# Patient Record
Sex: Male | Born: 1970 | Race: White | Hispanic: No | Marital: Married | Smoking: Never smoker
Health system: Southern US, Community
[De-identification: ages and names within clinical notes are randomized; demographics above are authoritative.]

## PROBLEM LIST (undated history)

## (undated) DIAGNOSIS — N390 Urinary tract infection, site not specified: Secondary | ICD-10-CM

## (undated) HISTORY — DX: Urinary tract infection, site not specified: N39.0

## (undated) HISTORY — PX: NO PAST SURGERIES: SHX2092

---

## 2008-04-01 ENCOUNTER — Ambulatory Visit: Payer: Self-pay | Admitting: Family Medicine

## 2012-02-28 ENCOUNTER — Emergency Department: Payer: Self-pay | Admitting: Emergency Medicine

## 2015-09-22 ENCOUNTER — Ambulatory Visit
Admission: EM | Admit: 2015-09-22 | Discharge: 2015-09-22 | Disposition: A | Discharge status: Home / Self Care | Payer: BC Managed Care – PPO | Attending: Family Medicine | Admitting: Family Medicine

## 2015-09-22 ENCOUNTER — Encounter: Payer: Self-pay | Admitting: Emergency Medicine

## 2015-09-22 DIAGNOSIS — Z79899 Other long term (current) drug therapy: Secondary | ICD-10-CM | POA: Insufficient documentation

## 2015-09-22 DIAGNOSIS — N390 Urinary tract infection, site not specified: Secondary | ICD-10-CM | POA: Diagnosis not present

## 2015-09-22 DIAGNOSIS — R300 Dysuria: Secondary | ICD-10-CM | POA: Diagnosis present

## 2015-09-22 LAB — URINALYSIS COMPLETE WITH MICROSCOPIC (ARMC ONLY)
Glucose, UA: NEGATIVE mg/dL
Ketones, ur: NEGATIVE mg/dL
Nitrite: NEGATIVE
PH: 6.5 (ref 5.0–8.0)
PROTEIN: 100 mg/dL — AB
Specific Gravity, Urine: 1.02 (ref 1.005–1.030)

## 2015-09-22 MED ORDER — CIPROFLOXACIN HCL 500 MG PO TABS: 500.0000 mg | ORAL_TABLET | Freq: Two times a day (BID) | ORAL | Status: DC

## 2015-09-22 NOTE — Discharge Instructions (Signed)

## 2015-09-22 NOTE — ED Notes (Signed)
Patient c/o fever and difficulty urinating that started on Saturday.  Patient denies pain or burning.

## 2015-09-22 NOTE — ED Provider Notes (Signed)
CSN: [id number]     Arrival date & time 09/22/15  0909 History   First MD Initiated Contact with Patient 09/22/15 [phone]     Chief Complaint  Patient presents with  . Dysuria   (Consider location/radiation/quality/duration/timing/severity/associated sxs/prior Treatment) HPI Comments: 45 yo male with a 5 days h/o progressively worsening discomfort with urination (mild burning).  Patient states has had urinary frequency and fevers (subjective; did not check his temperature). Denies any abdominal pain, flank pain, hematuria, penile discharge, vomiting.   The history is provided by the patient.    History reviewed. No pertinent past medical history. History reviewed. No pertinent past surgical history. History reviewed. No pertinent family history. Social History  Substance Use Topics  . Smoking status: Never Smoker   . Smokeless tobacco: Never Used  . Alcohol Use: No    Review of Systems  Allergies  Review of patient's allergies indicates no known allergies.  Home Medications   Prior to Admission medications   Medication Sig Start Date End Date Taking? Authorizing Provider  ciprofloxacin (CIPRO) 500 MG tablet Take 1 tablet (500 mg total) by mouth every 12 (twelve) hours. 09/22/15   Payton Mccallumrlando Arn Mcomber, MD   Meds Ordered and Administered this Visit  Medications - No data to display  BP 132/96 mmHg  Pulse 74  Temp(Src) 97.9 F (36.6 C) (Oral)  Resp 16  Ht 6\' 2"  (1.88 m)  Wt 265 lb (120.203 kg)  BMI 34.01 kg/m2  SpO2 96% No data found.   Physical Exam  Constitutional: He is oriented to person, place, and time. He appears well-developed and well-nourished. No distress.  HENT:  Head: Normocephalic and atraumatic.  Abdominal: Soft. He exhibits no distension.  Neurological: He is alert and oriented to person, place, and time.  Skin: He is not diaphoretic.  Nursing note and vitals reviewed.   ED Course  Procedures (including critical care time)  Labs Review Labs Reviewed   URINALYSIS COMPLETEWITH MICROSCOPIC ([facility] ONLY) - Abnormal; Notable for the following:    APPearance CLOUDY (*)    Bilirubin Urine 1+ (*)    Hgb urine dipstick 2+ (*)    Protein, ur 100 (*)    Leukocytes, UA 3+ (*)    Bacteria, UA FEW (*)    Squamous Epithelial / LPF 0-5 (*)    All other components within normal limits  URINE CULTURE    Imaging Review No results found.   Visual Acuity Review  Right Eye Distance:   Left Eye Distance:   Bilateral Distance:    Right Eye Near:   Left Eye Near:    Bilateral Near:         MDM   1. UTI (lower urinary tract infection)    Discharge Medication List as of 09/22/2015 10:03 AM    START taking these medications   Details  ciprofloxacin (CIPRO) 500 MG tablet Take 1 tablet (500 mg total) by mouth every 12 (twelve) hours., Starting 09/22/2015, Until Discontinued, Normal       1. Lab results and diagnosis reviewed with patient 2. rx as per orders above; reviewed possible side effects, interactions, risks and benefits  3. Recommend supportive treatment with increased water intake 4. Recommend establish care with a  PCP 5. Follow-up prn if symptoms worsen or don't improve    Payton Mccallumrlando Purvis Sidle, MD 09/22/15 1016

## 2015-09-24 LAB — URINE CULTURE: Culture: >=100000 — AB

## 2016-06-26 ENCOUNTER — Ambulatory Visit (INDEPENDENT_AMBULATORY_CARE_PROVIDER_SITE_OTHER): Payer: BC Managed Care – PPO | Admitting: Family Medicine

## 2016-06-26 ENCOUNTER — Encounter: Payer: Self-pay | Admitting: Family Medicine

## 2016-06-26 VITALS — BP 158/104 | HR 71 | Temp 98.3°F | Ht 73.0 in | Wt 295.0 lb

## 2016-06-26 DIAGNOSIS — E669 Obesity, unspecified: Secondary | ICD-10-CM | POA: Insufficient documentation

## 2016-06-26 DIAGNOSIS — I10 Essential (primary) hypertension: Secondary | ICD-10-CM | POA: Insufficient documentation

## 2016-06-26 DIAGNOSIS — R030 Elevated blood-pressure reading, without diagnosis of hypertension: Secondary | ICD-10-CM

## 2016-06-26 DIAGNOSIS — Z0001 Encounter for general adult medical examination with abnormal findings: Secondary | ICD-10-CM | POA: Insufficient documentation

## 2016-06-26 DIAGNOSIS — Z0000 Encounter for general adult medical examination without abnormal findings: Secondary | ICD-10-CM

## 2016-06-26 LAB — LIPID PANEL
CHOL/HDL RATIO: 6
Cholesterol: 214 mg/dL — ABNORMAL HIGH (ref 0–200)
HDL: 35.1 mg/dL — ABNORMAL LOW (ref >39.00)
LDL CALC: 145 mg/dL — AB (ref 0–99)
NonHDL: 179.16
TRIGLYCERIDES: 172 mg/dL — AB (ref 0.0–149.0)
VLDL: 34.4 mg/dL (ref 0.0–40.0)

## 2016-06-26 LAB — CBC
HCT: 47.6 % (ref 39.0–52.0)
HEMOGLOBIN: 16.2 g/dL (ref 13.0–17.0)
MCHC: 33.9 g/dL (ref 30.0–36.0)
MCV: 90.5 fl (ref 78.0–100.0)
Platelets: 317 10*3/uL (ref 150.0–400.0)
RBC: 5.26 Mil/uL (ref 4.22–5.81)
RDW: 12.6 % (ref 11.5–15.5)
WBC: 10.3 10*3/uL (ref 4.0–10.5)

## 2016-06-26 LAB — COMPREHENSIVE METABOLIC PANEL
ALT: 22 U/L (ref 0–53)
AST: 20 U/L (ref 0–37)
Albumin: 4.5 g/dL (ref 3.5–5.2)
Alkaline Phosphatase: 85 U/L (ref 39–117)
BUN: 11 mg/dL (ref 6–23)
CALCIUM: 9.6 mg/dL (ref 8.4–10.5)
CHLORIDE: 100 meq/L (ref 96–112)
CO2: 32 meq/L (ref 19–32)
CREATININE: 0.95 mg/dL (ref 0.40–1.50)
GFR: 90.3 mL/min (ref >60.00)
Glucose, Bld: 120 mg/dL — ABNORMAL HIGH (ref 70–99)
POTASSIUM: 4.1 meq/L (ref 3.5–5.1)
SODIUM: 139 meq/L (ref 135–145)
Total Bilirubin: 0.7 mg/dL (ref 0.2–1.2)
Total Protein: 7.6 g/dL (ref 6.0–8.3)

## 2016-06-26 LAB — HEMOGLOBIN A1C: Hgb A1c MFr Bld: 7.3 % — ABNORMAL HIGH (ref 4.6–6.5)

## 2016-06-26 NOTE — Progress Notes (Signed)
Pre visit review using our clinic review tool, if applicable. No additional management support is needed unless otherwise documented below in the visit note. 

## 2016-06-26 NOTE — Assessment & Plan Note (Signed)
Tetanus up-to-date. Declines flu vaccine. Screening labs today. BP elevated. See separate problem.

## 2016-06-26 NOTE — Patient Instructions (Signed)
Check BP at home.  Follow up in 2 weeks.  Take care  Dr. Adriana Simas     Health Maintenance, Male A healthy lifestyle and preventive care is important for your health and wellness. Ask your health care provider about what schedule of regular examinations is right for you. What should I know about weight and diet?  Eat a Healthy Diet  Eat plenty of vegetables, fruits, whole grains, low-fat dairy products, and lean protein.  Do not eat a lot of foods high in solid fats, added sugars, or salt. Maintain a Healthy Weight  Regular exercise can help you achieve or maintain a healthy weight. You should:  Do at least 150 minutes of exercise each week. The exercise should increase your heart rate and make you sweat (moderate-intensity exercise).  Do strength-training exercises at least twice a week. Watch Your Levels of Cholesterol and Blood Lipids  Have your blood tested for lipids and cholesterol every 5 years starting at 47 years of age. If you are at high risk for heart disease, you should start having your blood tested when you are 47 years old. You may need to have your cholesterol levels checked more often if:  Your lipid or cholesterol levels are high.  You are older than 47 years of age.  You are at high risk for heart disease. What should I know about cancer screening? Many types of cancers can be detected early and may often be prevented. Lung Cancer  You should be screened every year for lung cancer if:  You are a current smoker who has smoked for at least 30 years.  You are a former smoker who has quit within the past 15 years.  Talk to your health care provider about your screening options, when you should start screening, and how often you should be screened. Colorectal Cancer  Routine colorectal cancer screening usually begins at 48 years of age and should be repeated every 5-10 years until you are 47 years old. You may need to be screened more often if early forms of  precancerous polyps or small growths are found. Your health care provider may recommend screening at an earlier age if you have risk factors for colon cancer.  Your health care provider may recommend using home test kits to check for hidden blood in the stool.  A small camera at the end of a tube can be used to examine your colon (sigmoidoscopy or colonoscopy). This checks for the earliest forms of colorectal cancer. Prostate and Testicular Cancer  Depending on your age and overall health, your health care provider may do certain tests to screen for prostate and testicular cancer.  Talk to your health care provider about any symptoms or concerns you have about testicular or prostate cancer. Skin Cancer  Check your skin from head to toe regularly.  Tell your health care provider about any new moles or changes in moles, especially if:  There is a change in a mole's size, shape, or color.  You have a mole that is larger than a pencil eraser.  Always use sunscreen. Apply sunscreen liberally and repeat throughout the day.  Protect yourself by wearing long sleeves, pants, a wide-brimmed hat, and sunglasses when outside. What should I know about heart disease, diabetes, and high blood pressure?  If you are 79-65 years of age, have your blood pressure checked every 3-5 years. If you are 15 years of age or older, have your blood pressure checked every year. You should have your  blood pressure measured twice-once when you are at a hospital or clinic, and once when you are not at a hospital or clinic. Record the average of the two measurements. To check your blood pressure when you are not at a hospital or clinic, you can use:  An automated blood pressure machine at a pharmacy.  A home blood pressure monitor.  Talk to your health care provider about your target blood pressure.  If you are between 3345-47 years old, ask your health care provider if you should take aspirin to prevent heart  disease.  Have regular diabetes screenings by checking your fasting blood sugar level.  If you are at a normal weight and have a low risk for diabetes, have this test once every three years after the age of 47.  If you are overweight and have a high risk for diabetes, consider being tested at a younger age or more often.  A one-time screening for abdominal aortic aneurysm (AAA) by ultrasound is recommended for men aged 65-75 years who are current or former smokers. What should I know about preventing infection? Hepatitis B  If you have a higher risk for hepatitis B, you should be screened for this virus. Talk with your health care provider to find out if you are at risk for hepatitis B infection. Hepatitis C  Blood testing is recommended for:  Everyone born from [id number] through 1965.  Anyone with known risk factors for hepatitis C. Sexually Transmitted Diseases (STDs)  You should be screened each year for STDs including gonorrhea and chlamydia if:  You are sexually active and are younger than 47 years of age.  You are older than 47 years of age and your health care provider tells you that you are at risk for this type of infection.  Your sexual activity has changed since you were last screened and you are at an increased risk for chlamydia or gonorrhea. Ask your health care provider if you are at risk.  Talk with your health care provider about whether you are at high risk of being infected with HIV. Your health care provider may recommend a prescription medicine to help prevent HIV infection. What else can I do?  Schedule regular health, dental, and eye exams.  Stay current with your vaccines (immunizations).  Do not use any tobacco products, such as cigarettes, chewing tobacco, and e-cigarettes. If you need help quitting, ask your health care provider.  Limit alcohol intake to no more than 2 drinks per day. One drink equals 12 ounces of beer, 5 ounces of wine, or 1 ounces of hard  liquor.  Do not use street drugs.  Do not share needles.  Ask your health care provider for help if you need support or information about quitting drugs.  Tell your health care provider if you often feel depressed.  Tell your health care provider if you have ever been abused or do not feel safe at home. This information is not intended to replace advice given to you by your health care provider. Make sure you discuss any questions you have with your health care provider. Document Released: 09/16/2007 Document Revised: 11/17/2015 Document Reviewed: 12/22/2014 Elsevier Interactive Patient Education  2017 [employer]

## 2016-06-26 NOTE — Progress Notes (Signed)
Subjective:  Patient ID: Bradley Stevens, male    DOB: 11/09/1970  Age: 46 y.o. MRN: [id number]  CC: Establish care  HPI Bradley Stevens is a 46 y.o. male presents to the clinic today to establish care.  Preventative Healthcare  Colonoscopy: Not indicated.  Immunizations  Tetanus - Up to date.   Pneumococcal - Not indicated.   Flu - Declines.  Zoster - Not indicated.  Prostate cancer screening: No family hx. Will not proceed with screening at this time.  Hepatitis C screening - Not indicated.  Labs: Screening labs today.  Alcohol use: No.  Smoking/tobacco use: Smokeless tobacco user.  Regular dental exams: Yes.  PMH, Surgical Hx, Family Hx, Social History reviewed and updated as below.  Past Medical History:  Diagnosis Date  . UTI (urinary tract infection)      Past Surgical History:  Procedure Laterality Date  . NO PAST SURGERIES     Family History  Problem Relation Age of Onset  . Arthritis Mother   . Breast cancer Mother   . Hyperlipidemia Mother   . Heart disease Mother   . Hypertension Mother   . Mental illness Mother   . Lung cancer Father   . Arthritis Maternal Grandmother   . Stroke Maternal Grandmother   . Mental illness Maternal Grandmother   . Alcohol abuse Maternal Grandfather   . Arthritis Maternal Grandfather   . Hyperlipidemia Maternal Grandfather   . Heart disease Maternal Grandfather   . Hypertension Maternal Grandfather   . Diabetes Maternal Grandfather   . Arthritis Paternal Grandmother   . Diabetes Paternal Grandmother   . Arthritis Paternal Grandfather   . Lung cancer Paternal Grandfather    Social History  Substance Use Topics  . Smoking status: Never Smoker  . Smokeless tobacco: Current User    Types: Snuff  . Alcohol use No    Review of Systems  Skin:       Skin lesions.  All other systems reviewed and are negative.   Objective:   Today's Vitals: BP (!) 158/104 (BP Location: Left Arm, Cuff Size: Large)    Pulse 71   Temp 98.3 F (36.8 C) (Oral)   Ht 6\' 1"  (1.854 m)   Wt 295 lb (133.8 kg)   SpO2 97%   BMI 38.92 kg/m   Physical Exam  Constitutional: He is oriented to person, place, and time. He appears well-developed and well-nourished. No distress.  HENT:  Head: Normocephalic and atraumatic.  Nose: Nose normal.  Mouth/Throat: Oropharynx is clear and moist. No oropharyngeal exudate.  Normal TM's bilaterally.   Eyes: Conjunctivae are normal. No scleral icterus.  Neck: Neck supple.  Cardiovascular: Normal rate and regular rhythm.   No murmur heard. Pulmonary/Chest: Effort normal and breath sounds normal. He has no wheezes. He has no rales.  Abdominal: Soft. He exhibits no distension. There is no tenderness. There is no rebound and no guarding.  Musculoskeletal: Normal range of motion. He exhibits no edema.  Lymphadenopathy:    He has no cervical adenopathy.  Neurological: He is alert and oriented to person, place, and time.  Skin: Skin is warm and dry. No rash noted.  Psychiatric: He has a normal mood and affect.  Vitals reviewed.   Assessment & Plan:   Problem List Items Addressed This Visit    Encounter for general adult medical examination with abnormal findings - Primary    Tetanus up-to-date. Declines flu vaccine. Screening labs today. BP elevated. See separate problem.  Relevant Orders   CBC   Hemoglobin A1c   Comprehensive metabolic panel   Lipid panel   Elevated BP without diagnosis of hypertension    BP elevated and on repeat. I cannot diagnose hypertension until additional encounter (or home readings) with elevated BP. Patient to check his blood pressures at home and follow-up with me in 2 weeks to 1 month for repeat.         Outpatient Encounter Prescriptions as of 06/26/2016  Medication Sig  . [DISCONTINUED] ciprofloxacin (CIPRO) 500 MG tablet Take 1 tablet (500 mg total) by mouth every 12 (twelve) hours.   No facility-administered encounter  medications on file as of 06/26/2016.     Follow-up: 2 weeks to 1 month  Juddson Cobern DO [facility]

## 2016-06-26 NOTE — Assessment & Plan Note (Signed)
BP elevated and on repeat. I cannot diagnose hypertension until additional encounter (or home readings) with elevated BP. Patient to check his blood pressures at home and follow-up with me in 2 weeks to 1 month for repeat.

## 2016-07-17 ENCOUNTER — Ambulatory Visit (INDEPENDENT_AMBULATORY_CARE_PROVIDER_SITE_OTHER): Payer: BC Managed Care – PPO | Admitting: Family Medicine

## 2016-07-17 ENCOUNTER — Encounter: Payer: Self-pay | Admitting: Family Medicine

## 2016-07-17 DIAGNOSIS — E119 Type 2 diabetes mellitus without complications: Secondary | ICD-10-CM

## 2016-07-17 DIAGNOSIS — E782 Mixed hyperlipidemia: Secondary | ICD-10-CM

## 2016-07-17 DIAGNOSIS — I10 Essential (primary) hypertension: Secondary | ICD-10-CM | POA: Diagnosis not present

## 2016-07-17 MED ORDER — LOSARTAN POTASSIUM 50 MG PO TABS: 50.0000 mg | ORAL_TABLET | Freq: Every day | ORAL | 3 refills | Status: DC

## 2016-07-17 MED ORDER — ROSUVASTATIN CALCIUM 20 MG PO TABS: 20.0000 mg | ORAL_TABLET | Freq: Every day | ORAL | 3 refills | Status: DC

## 2016-07-17 NOTE — Assessment & Plan Note (Signed)
New problem/new diagnosis. Patient elected for dietary/lifestyle changes.

## 2016-07-17 NOTE — Progress Notes (Signed)
Pre visit review using our clinic review tool, if applicable. No additional management support is needed unless otherwise documented below in the visit note. 

## 2016-07-17 NOTE — Progress Notes (Signed)
Subjective:  Patient ID: Bradley Stevens, male    DOB: November 24, 1970  Age: 46 y.o. MRN: [id number]  CC: Follow up  HPI:  46 year old male present for follow up.  Elevated BP  BP elevated at last visit.  Significantly elevated today.  Will discuss diagnosis of hypertension and treatment options.  DM 2  New diagnosis.  After his last visit his labs returned with an A1c of 7.3.  He is not currently on any medication.  He would like to discuss treatment options including lifestyle change today.  Hyperlipidemia  New problem.  Currently untreated.  We'll discuss today.  Social Hx   Social History   Social History  . Marital status: Married    Spouse name: N/A  . Number of children: N/A  . Years of education: N/A   Social History Main Topics  . Smoking status: Never Smoker  . Smokeless tobacco: Current User    Types: Snuff  . Alcohol use No  . Drug use: No  . Sexual activity: Yes    Partners: Female   Other Topics Concern  . None   Social History Narrative  . None    Review of Systems  Constitutional: Negative.   Skin:       Skin cysts, tags, nevi.   Objective:  BP (!) 158/104   Pulse 82   Temp 98.2 F (36.8 C) (Oral)   Wt 294 lb (133.4 kg)   SpO2 95%   BMI 38.79 kg/m   BP/Weight 07/17/2016 06/26/2016 09/22/2015  Systolic BP 158 158 132  Diastolic BP 104 104 96  Wt. (Lbs) 294 295 265  BMI 38.79 38.92 34.01   Physical Exam  Constitutional: He is oriented to person, place, and time. He appears well-developed. No distress.  Cardiovascular: Normal rate and regular rhythm.   Pulmonary/Chest: Effort normal and breath sounds normal.  Neurological: He is alert and oriented to person, place, and time.  Psychiatric: He has a normal mood and affect.  Vitals reviewed.   Lab Results  Component Value Date   WBC 10.3 06/26/2016   HGB 16.2 06/26/2016   HCT 47.6 06/26/2016   PLT 317.0 06/26/2016   GLUCOSE 120 (H) 06/26/2016   CHOL 214 (H) 06/26/2016    TRIG 172.0 (H) 06/26/2016   HDL 35.10 (L) 06/26/2016   LDLCALC 145 (H) 06/26/2016   ALT 22 06/26/2016   AST 20 06/26/2016   NA 139 06/26/2016   K 4.1 06/26/2016   CL 100 06/26/2016   CREATININE 0.95 06/26/2016   BUN 11 06/26/2016   CO2 32 06/26/2016   HGBA1C 7.3 (H) 06/26/2016    Assessment & Plan:   Problem List Items Addressed This Visit    Mixed hyperlipidemia    New problem. Starting on Crestor.       Relevant Medications   losartan (COZAAR) 50 MG tablet   rosuvastatin (CRESTOR) 20 MG tablet   Essential hypertension    New diagnosis. Starting on Losartan.      Relevant Medications   losartan (COZAAR) 50 MG tablet   rosuvastatin (CRESTOR) 20 MG tablet   DM (diabetes mellitus), type 2 (HCC)    New problem/new diagnosis. Patient elected for dietary/lifestyle changes.      Relevant Medications   losartan (COZAAR) 50 MG tablet   rosuvastatin (CRESTOR) 20 MG tablet      Meds ordered this encounter  Medications  . losartan (COZAAR) 50 MG tablet    Sig: Take 1 tablet (50 mg  total) by mouth daily.    Dispense:  90 tablet    Refill:  3  . rosuvastatin (CRESTOR) 20 MG tablet    Sig: Take 1 tablet (20 mg total) by mouth daily.    Dispense:  90 tablet    Refill:  3   Follow-up: Return in about 3 months (around 10/16/2016).  Everlene Other DO [facility]

## 2016-07-17 NOTE — Assessment & Plan Note (Signed)
New problem. Starting on Crestor. 

## 2016-07-17 NOTE — Assessment & Plan Note (Signed)
New diagnosis. Starting on Losartan.

## 2016-07-17 NOTE — Patient Instructions (Signed)
Medications as prescribed.  Follow-up in 3 months  Take care  Dr. Rider Ermis  

## 2016-10-16 ENCOUNTER — Encounter: Payer: Self-pay | Admitting: Family Medicine

## 2016-10-16 ENCOUNTER — Ambulatory Visit (INDEPENDENT_AMBULATORY_CARE_PROVIDER_SITE_OTHER): Payer: BC Managed Care – PPO | Admitting: Family Medicine

## 2016-10-16 VITALS — BP 128/88 | HR 81 | Temp 98.2°F | Resp 18 | Ht 73.0 in | Wt 292.0 lb

## 2016-10-16 DIAGNOSIS — E119 Type 2 diabetes mellitus without complications: Secondary | ICD-10-CM | POA: Diagnosis not present

## 2016-10-16 DIAGNOSIS — I10 Essential (primary) hypertension: Secondary | ICD-10-CM

## 2016-10-16 DIAGNOSIS — E782 Mixed hyperlipidemia: Secondary | ICD-10-CM | POA: Diagnosis not present

## 2016-10-16 NOTE — Patient Instructions (Signed)
Continue your medications.  Follow up in 6 months.  Dr. Adriana Simasook

## 2016-10-16 NOTE — Assessment & Plan Note (Signed)
Lipid panel today.  Continue Crestor. 

## 2016-10-16 NOTE — Assessment & Plan Note (Signed)
Checking A1C today. Needs continued dietary changes and weight loss.

## 2016-10-16 NOTE — Progress Notes (Signed)
Subjective:  Patient ID: COURY GRIEGER, male    DOB: 1970/06/29  Age: 46 y.o. MRN: [id number]  CC: Follow up  HPI:  46 year old male with HTN, DM-2, HLD presents for follow up.  HTN  BP improved.   Doing well on Losartan.  DM-2  Has declined pharmacotherapy.  Trying to lose weight.  Needs A1C.  HLD  Tolerating statin. No side effects.  Needs repeat lipid panel today.   Social Hx   Social History   Social History  . Marital status: Married    Spouse name: N/A  . Number of children: N/A  . Years of education: N/A   Social History Main Topics  . Smoking status: Never Smoker  . Smokeless tobacco: Current User    Types: Snuff  . Alcohol use No  . Drug use: No  . Sexual activity: Yes    Partners: Female   Other Topics Concern  . None   Social History Narrative  . None    Review of Systems  Constitutional: Negative.   Respiratory: Negative.   Cardiovascular: Negative.    Objective:  BP 128/88   Pulse 81   Temp 98.2 F (36.8 C) (Oral)   Resp 18   Ht 6\' 1"  (1.854 m)   Wt 292 lb (132.5 kg)   SpO2 96%   BMI 38.52 kg/m   BP/Weight 10/16/2016 07/17/2016 06/26/2016  Systolic BP 128 158 158  Diastolic BP 88 104 104  Wt. (Lbs) 292 294 295  BMI 38.52 38.79 38.92    Physical Exam  Constitutional: He is oriented to person, place, and time. He appears well-developed. No distress.  Cardiovascular: Normal rate and regular rhythm.   No murmur heard. Pulmonary/Chest: Effort normal and breath sounds normal. He has no wheezes. He has no rales.  Neurological: He is alert and oriented to person, place, and time.  Psychiatric: He has a normal mood and affect.  Vitals reviewed.   Lab Results  Component Value Date   WBC 10.3 06/26/2016   HGB 16.2 06/26/2016   HCT 47.6 06/26/2016   PLT 317.0 06/26/2016   GLUCOSE 120 (H) 06/26/2016   CHOL 214 (H) 06/26/2016   TRIG 172.0 (H) 06/26/2016   HDL 35.10 (L) 06/26/2016   LDLCALC 145 (H) 06/26/2016   ALT 22  06/26/2016   AST 20 06/26/2016   NA 139 06/26/2016   K 4.1 06/26/2016   CL 100 06/26/2016   CREATININE 0.95 06/26/2016   BUN 11 06/26/2016   CO2 32 06/26/2016   HGBA1C 7.3 (H) 06/26/2016    Assessment & Plan:   Problem List Items Addressed This Visit      Cardiovascular and Mediastinum   Essential hypertension    Stable. Continue Losartan.      Relevant Medications   aspirin EC 81 MG tablet     Endocrine   DM (diabetes mellitus), type 2 (HCC) - Primary    Checking A1C today. Needs continued dietary changes and weight loss.       Relevant Medications   aspirin EC 81 MG tablet   Other Relevant Orders   Hemoglobin A1c     Other   Mixed hyperlipidemia    Lipid panel today. Continue Crestor.       Relevant Medications   aspirin EC 81 MG tablet   Other Relevant Orders   Lipid panel      Meds ordered this encounter  Medications  . aspirin EC 81 MG tablet  Sig: Take 81 mg by mouth daily.   Follow-up: 6 months  Michaelyn Wall Adriana Simasook DO [facility] [place]

## 2016-10-16 NOTE — Assessment & Plan Note (Signed)
Stable. Continue Losartan. 

## 2016-10-17 LAB — LIPID PANEL
CHOLESTEROL: 102 mg/dL (ref 0–200)
HDL: 32.3 mg/dL — AB (ref >39.00)
LDL Cholesterol: 41 mg/dL (ref 0–99)
NonHDL: 69.68
TRIGLYCERIDES: 141 mg/dL (ref 0.0–149.0)
Total CHOL/HDL Ratio: 3
VLDL: 28.2 mg/dL (ref 0.0–40.0)

## 2016-10-17 LAB — HEMOGLOBIN A1C: HEMOGLOBIN A1C: 6.9 % — AB (ref 4.6–6.5)

## 2017-04-20 ENCOUNTER — Encounter: Payer: Self-pay | Admitting: Family Medicine

## 2017-04-20 ENCOUNTER — Ambulatory Visit: Payer: BC Managed Care – PPO | Admitting: Family Medicine

## 2017-04-20 ENCOUNTER — Ambulatory Visit (INDEPENDENT_AMBULATORY_CARE_PROVIDER_SITE_OTHER): Payer: BC Managed Care – PPO | Admitting: Family Medicine

## 2017-04-20 ENCOUNTER — Other Ambulatory Visit: Payer: Self-pay

## 2017-04-20 VITALS — BP 140/100 | HR 64 | Temp 98.3°F | Wt 295.8 lb

## 2017-04-20 DIAGNOSIS — L918 Other hypertrophic disorders of the skin: Secondary | ICD-10-CM | POA: Diagnosis not present

## 2017-04-20 DIAGNOSIS — E119 Type 2 diabetes mellitus without complications: Secondary | ICD-10-CM

## 2017-04-20 DIAGNOSIS — I10 Essential (primary) hypertension: Secondary | ICD-10-CM | POA: Diagnosis not present

## 2017-04-20 DIAGNOSIS — F17220 Nicotine dependence, chewing tobacco, uncomplicated: Secondary | ICD-10-CM | POA: Insufficient documentation

## 2017-04-20 DIAGNOSIS — Z720 Tobacco use: Secondary | ICD-10-CM

## 2017-04-20 DIAGNOSIS — E782 Mixed hyperlipidemia: Secondary | ICD-10-CM | POA: Diagnosis not present

## 2017-04-20 DIAGNOSIS — E669 Obesity, unspecified: Secondary | ICD-10-CM

## 2017-04-20 LAB — COMPREHENSIVE METABOLIC PANEL
ALT: 20 U/L (ref 0–53)
AST: 21 U/L (ref 0–37)
Albumin: 4.4 g/dL (ref 3.5–5.2)
Alkaline Phosphatase: 83 U/L (ref 39–117)
BUN: 12 mg/dL (ref 6–23)
CALCIUM: 9.3 mg/dL (ref 8.4–10.5)
CHLORIDE: 99 meq/L (ref 96–112)
CO2: 33 meq/L — AB (ref 19–32)
CREATININE: 1 mg/dL (ref 0.40–1.50)
GFR: 84.81 mL/min (ref >60.00)
Glucose, Bld: 105 mg/dL — ABNORMAL HIGH (ref 70–99)
Potassium: 4.4 mEq/L (ref 3.5–5.1)
SODIUM: 138 meq/L (ref 135–145)
Total Bilirubin: 0.6 mg/dL (ref 0.2–1.2)
Total Protein: 7.1 g/dL (ref 6.0–8.3)

## 2017-04-20 LAB — HEMOGLOBIN A1C: Hgb A1c MFr Bld: 8.1 % — ABNORMAL HIGH (ref 4.6–6.5)

## 2017-04-20 LAB — LDL CHOLESTEROL, DIRECT: LDL DIRECT: 65 mg/dL

## 2017-04-20 NOTE — Assessment & Plan Note (Signed)
Refer to dermatology for removal of the eyelid lesion as well as evaluation his visit other skin tags.  Also evaluate cysts on back.

## 2017-04-20 NOTE — Assessment & Plan Note (Signed)
Check lab work today.  Continue Crestor.

## 2017-04-20 NOTE — Progress Notes (Signed)
Tommi Rumps, MD Phone: [phone]  Bradley Stevens is a 47 y.o. male who presents today for follow-up.  Hypertension: Not checking at home.  He is taking losartan.  No chest pain or shortness of breath.  Hyperlipidemia: Taking Crestor.  No right upper quadrant pain or myalgias.  Diabetes: Patient reports he was told he was borderline.  I advised him that he did indeed have diabetes.  He notes no polyuria or polydipsia.  He is due to see ophthalmology soon.  Has been working on exercising a little bit by walking and biking.  Also watching his diet with grilled chicken and salads.  2 sodas per day.  Tobacco abuse: Patient has been using a can of dip every 2 days for the last 35 years.  He tried Chantix before though did not give it enough time to work.  He tried nicotine replacement as well though that does not agree with him.  He reports a skin tag on his left lateral upper eyelid that has been bothersome.  He has skin tags underneath his arms.  He has several cysts on his back that come and go.  He wants to see dermatology.  Social History   Tobacco Use  Smoking Status Never Smoker  Smokeless Tobacco Current User  . Types: Snuff     ROS see history of present illness  Objective  Physical Exam Vitals:   04/20/17 1423  BP: (!) 140/100  Pulse: 64  Temp: 98.3 F (36.8 C)  SpO2: 98%    BP Readings from Last 3 Encounters:  04/20/17 (!) 140/100  10/16/16 128/88  07/17/16 (!) 158/104   Wt Readings from Last 3 Encounters:  04/20/17 295 lb 12.8 oz (134.2 kg)  10/16/16 292 lb (132.5 kg)  07/17/16 294 lb (133.4 kg)    Physical Exam  Constitutional: No distress.  Cardiovascular: Normal rate, regular rhythm and normal heart sounds.  Pulmonary/Chest: Effort normal and breath sounds normal.  Musculoskeletal: He exhibits no edema.  Neurological: He is alert. Gait normal.  Skin: Skin is warm and dry. He is not diaphoretic.  Skin tag left upper lateral eyelid, skin tags  underneath right axilla, several apparent cysts just right of midline thoracic spine, no signs of infection or inflammation     Assessment/Plan: Please see individual problem list.  Obesity (BMI 30-39.9) Encourage diet and exercise.  Mixed hyperlipidemia Check lab work today.  Continue Crestor.  Achrochordon Refer to dermatology for removal of the eyelid lesion as well as evaluation his visit other skin tags.  Also evaluate cysts on back.  DM (diabetes mellitus), type 2 ([place]) Patient was unaware that he was diabetic.  I informed him of this.  He will work on diet and exercise.  He will see ophthalmology as planned.  Check A1c.  Essential hypertension Above goal.  Discussed increasing his medication though he was hesitant to do this.  He is not checking at home and he will check his blood pressure daily at home for the next week and call us.  If still elevated would increase losartan.  Tobacco abuse Using dip.  Interested in quitting.  Will check with our clinical pharmacist regarding whether or not Wellbutrin or Chantix would be options given his use of dip.   Orders Placed This Encounter  Procedures  . Hemoglobin A1c    Standing Status:   Future    Number of Occurrences:   1    Standing Expiration Date:   04/20/2018  . Comp Met (CMET)  Standing Status:   Future    Number of Occurrences:   1    Standing Expiration Date:   04/20/2018  . Direct LDL  . Ambulatory referral to Dermatology    Referral Priority:   Routine    Referral Type:   Consultation    Referral Reason:   Specialty Services Required    Requested Specialty:   Dermatology    Number of Visits Requested:   1    No orders of the defined types were placed in this encounter.    Tommi Rumps, MD [place]

## 2017-04-20 NOTE — Assessment & Plan Note (Signed)
Encourage diet and exercise. 

## 2017-04-20 NOTE — Assessment & Plan Note (Signed)
Patient was unaware that he was diabetic.  I informed him of this.  He will work on diet and exercise.  He will see ophthalmology as planned.  Check A1c.

## 2017-04-20 NOTE — Patient Instructions (Addendum)
Nice to see you. We will call you with your lab results. Please continue to work on diet and exercise. We will contact you with tobacco cessation options. You have been referred to dermatology.

## 2017-04-20 NOTE — Assessment & Plan Note (Signed)
Using dip.  Interested in quitting.  Will check with our clinical pharmacist regarding whether or not Wellbutrin or Chantix would be options given his use of dip.

## 2017-04-20 NOTE — Assessment & Plan Note (Signed)
Above goal.  Discussed increasing his medication though he was hesitant to do this.  He is not checking at home and he will check his blood pressure daily at home for the next week and call us.  If still elevated would increase losartan.

## 2017-04-25 ENCOUNTER — Other Ambulatory Visit: Payer: Self-pay | Admitting: Family Medicine

## 2017-04-25 MED ORDER — METFORMIN HCL 500 MG PO TABS: 500.0000 mg | ORAL_TABLET | Freq: Two times a day (BID) | ORAL | 3 refills | Status: DC

## 2017-04-27 ENCOUNTER — Encounter: Payer: Self-pay | Admitting: Family Medicine

## 2017-04-27 DIAGNOSIS — I10 Essential (primary) hypertension: Secondary | ICD-10-CM

## 2017-04-29 ENCOUNTER — Telehealth: Payer: Self-pay | Admitting: Family Medicine

## 2017-04-29 NOTE — Telephone Encounter (Signed)
-----   Message from Allena Katzaroline E [facility], [facility] sent at 04/23/2017  9:58 AM EST ----- Hey Dr. De NurseSonneberg,   If you want me to see this patient for smoking cessation, this is written into our protocol and I have experience from working with Dr. Raymondo BandKoval.  Otherwise - Pharmacologic agents with data besides Chantix and wellbutrin include nortriptyline and clonidine. I like nortriptyline better especially in someone with anxiety, depression, or insomnia. Dose: 1 week prior to smoking cessation date, 75 to 100 mg/day ORALLY or titrate dose to desired serum levels recommended for depression; duration of treatment was 6 to 14 weeks (study dose).   Patient might do well with the NRT gum because of the "park and chew" method which would be reminiscent of dip? I would give the gum with wellbutrin or the nortriptyline, but not the Chantix just because of chantix's mechanism of action (partial nicotine receptor agonist).   Happy to see him and talk about options/follow up if you want.   Bradley Stevens   ----- Message ----- From: Glori LuisSonnenberg, Wakisha Alberts G, MD Sent: 04/20/2017   9:28 PM To: Allena Katzaroline E [facility], [facility]  [facility],  This guy is using dip and is interested in quitting. He has tried nicotine replacement. I know chantix and wellbutrin are approved for smoking cessation though was not sure about other tobacco products. Wanted to check with you. Thanks for your help.   Bradley Stevens

## 2017-04-29 NOTE — Telephone Encounter (Signed)
Please let the patient know that Chantix would be an option for his smoking cessation.  If he is willing to start on this I can provide it for him.  Thanks.

## 2017-04-30 NOTE — Telephone Encounter (Signed)
Left message to return call ok for pec to speak with patient

## 2017-04-30 NOTE — Telephone Encounter (Signed)
Please contact the patient to get him set up for lab work in one week. Order has been placed. Thanks.

## 2017-05-01 NOTE — Telephone Encounter (Signed)
scheduled

## 2017-05-09 ENCOUNTER — Other Ambulatory Visit: Payer: BC Managed Care – PPO

## 2017-05-10 ENCOUNTER — Other Ambulatory Visit (INDEPENDENT_AMBULATORY_CARE_PROVIDER_SITE_OTHER): Payer: BC Managed Care – PPO

## 2017-05-10 DIAGNOSIS — I10 Essential (primary) hypertension: Secondary | ICD-10-CM

## 2017-05-10 LAB — BASIC METABOLIC PANEL
BUN: 14 mg/dL (ref 6–23)
CALCIUM: 9.4 mg/dL (ref 8.4–10.5)
CO2: 30 meq/L (ref 19–32)
CREATININE: 1.13 mg/dL (ref 0.40–1.50)
Chloride: 102 mEq/L (ref 96–112)
GFR: 73.64 mL/min (ref >60.00)
Glucose, Bld: 86 mg/dL (ref 70–99)
Potassium: 4.8 mEq/L (ref 3.5–5.1)
Sodium: 138 mEq/L (ref 135–145)

## 2017-05-10 MED ORDER — VARENICLINE TARTRATE 0.5 MG X 11 & 1 MG X 42 PO MISC: ORAL | 0 refills | Status: DC

## 2017-05-10 NOTE — Telephone Encounter (Signed)
Chantix printed.  Please let the patient know he will need to contact us for refills.  He should pick a quit date and start the Chantix 1 week prior.  He should not use any nicotine replacement with this.  Thanks.

## 2017-05-10 NOTE — Telephone Encounter (Signed)
Patient would like to try the chantix please send to pharmacy

## 2017-05-10 NOTE — Addendum Note (Signed)
Addended by: Glori LuisSONNENBERG, Ayrabella Labombard G on: 05/10/2017 06:23 PM   Modules accepted: Orders

## 2017-05-11 ENCOUNTER — Telehealth: Payer: Self-pay | Admitting: Family Medicine

## 2017-05-11 NOTE — Telephone Encounter (Signed)
Left message to return call, ok for pec to speak to patient about message below from Dr.Sonnenberg  Chantix printed.  Please let the patient know he will need to contact us for refills.  He should pick a quit date and start the Chantix 1 week prior.  He should not use any nicotine replacement with this.  Thanks.  rx faxed

## 2017-05-11 NOTE — Telephone Encounter (Signed)
Reviewed physician's note regarding Chantix prescription with patient. Acknowledged understanding.

## 2017-05-15 NOTE — Telephone Encounter (Signed)
Patient notified

## 2017-06-21 ENCOUNTER — Telehealth: Payer: Self-pay

## 2017-06-21 DIAGNOSIS — E782 Mixed hyperlipidemia: Secondary | ICD-10-CM

## 2017-06-21 MED ORDER — LOSARTAN POTASSIUM 100 MG PO TABS: 100.0000 mg | ORAL_TABLET | Freq: Every day | ORAL | 3 refills | Status: DC

## 2017-06-21 NOTE — Telephone Encounter (Signed)
Rx for Losartan 100mg  take one daily [facility] [place] Patient has been notified.

## 2017-06-21 NOTE — Telephone Encounter (Signed)
New Rx for losartan 100 mg take one daily [facility] , please read previous phone encounter. Patient notified. Copied from CRM [phone]. Topic: General - Other >> Jun 21, 2017 12:03 PM Percival SpanishKennedy, Cheryl W wrote:  Pt cal lto say the below med was suppose to increased to 100mg  and is asking if this is still going to be done   losartan (COZAAR) 50 MG tablet  [place] [facility] [place]

## 2017-07-14 ENCOUNTER — Other Ambulatory Visit: Payer: Self-pay | Admitting: Family Medicine

## 2017-07-27 ENCOUNTER — Encounter: Payer: Self-pay | Admitting: Family Medicine

## 2017-07-27 ENCOUNTER — Other Ambulatory Visit: Payer: Self-pay

## 2017-07-27 ENCOUNTER — Ambulatory Visit (INDEPENDENT_AMBULATORY_CARE_PROVIDER_SITE_OTHER): Payer: BC Managed Care – PPO | Admitting: Family Medicine

## 2017-07-27 VITALS — BP 120/80 | HR 66 | Temp 98.2°F | Ht 73.0 in | Wt 286.6 lb

## 2017-07-27 DIAGNOSIS — E119 Type 2 diabetes mellitus without complications: Secondary | ICD-10-CM

## 2017-07-27 DIAGNOSIS — I10 Essential (primary) hypertension: Secondary | ICD-10-CM

## 2017-07-27 DIAGNOSIS — Z0000 Encounter for general adult medical examination without abnormal findings: Secondary | ICD-10-CM

## 2017-07-27 LAB — COMPREHENSIVE METABOLIC PANEL WITH GFR
ALT: 12 U/L (ref 0–53)
AST: 13 U/L (ref 0–37)
Albumin: 4.2 g/dL (ref 3.5–5.2)
Alkaline Phosphatase: 74 U/L (ref 39–117)
BUN: 13 mg/dL (ref 6–23)
CO2: 31 meq/L (ref 19–32)
Calcium: 9.3 mg/dL (ref 8.4–10.5)
Chloride: 102 meq/L (ref 96–112)
Creatinine, Ser: 1 mg/dL (ref 0.40–1.50)
GFR: 84.71 mL/min
Glucose, Bld: 94 mg/dL (ref 70–99)
Potassium: 4.7 meq/L (ref 3.5–5.1)
Sodium: 139 meq/L (ref 135–145)
Total Bilirubin: 0.5 mg/dL (ref 0.2–1.2)
Total Protein: 7 g/dL (ref 6.0–8.3)

## 2017-07-27 LAB — HEMOGLOBIN A1C: Hgb A1c MFr Bld: 7 % — ABNORMAL HIGH (ref 4.6–6.5)

## 2017-07-27 MED ORDER — VARENICLINE TARTRATE 0.5 MG X 11 & 1 MG X 42 PO MISC: ORAL | 0 refills | Status: DC

## 2017-07-27 NOTE — Assessment & Plan Note (Signed)
Physical exam completed.  Encouraged continued diet and exercise.  Patient interested in quitting dip.  Chantix sent to pharmacy as this has previously been discussed.  Discussed potential side effects related to this and reasons to discontinue the medication.  We will check lab work today.  We did confirm that his losartan was not part of the recent recalls.

## 2017-07-27 NOTE — Progress Notes (Signed)
Bradley Rumps, MD Phone: [phone]  Bradley Stevens is a 47 y.o. male who presents today for cpe.  Exercises by walking 1-1.5 miles 3 nights a week. He has cut down on sugary food and has decreased portion sizes.  He has a hard time getting his family on board with changing their diet. He declines HIV screening. Declines pneumonia vaccination. Tetanus vaccination up-to-date. No family history of colon cancer or prostate cancer. He is dipping for tobacco use.  No smoking.  He does have some motivation to quit and would like to try Chantix as we discussed previously. No alcohol use or illicit drug use. Sees a dentist every 6 months.  Ophthalmology once yearly.  Active Ambulatory Problems    Diagnosis Date Noted  . Obesity (BMI 30-39.9) 06/26/2016  . Routine general medical examination at a health care facility 06/26/2016  . Essential hypertension 06/26/2016  . DM (diabetes mellitus), type 2 ([place]) 07/17/2016  . Mixed hyperlipidemia 07/17/2016  . Achrochordon 04/20/2017  . Tobacco abuse 04/20/2017   Resolved Ambulatory Problems    Diagnosis Date Noted  . No Resolved Ambulatory Problems   Past Medical History:  Diagnosis Date  . UTI (urinary tract infection)     Family History  Problem Relation Age of Onset  . Arthritis Mother   . Breast cancer Mother   . Hyperlipidemia Mother   . Heart disease Mother   . Hypertension Mother   . Mental illness Mother   . Lung cancer Father   . Arthritis Maternal Grandmother   . Stroke Maternal Grandmother   . Mental illness Maternal Grandmother   . Alcohol abuse Maternal Grandfather   . Arthritis Maternal Grandfather   . Hyperlipidemia Maternal Grandfather   . Heart disease Maternal Grandfather   . Hypertension Maternal Grandfather   . Diabetes Maternal Grandfather   . Arthritis Paternal Grandmother   . Diabetes Paternal Grandmother   . Arthritis Paternal Grandfather   . Lung cancer Paternal Grandfather     Social History     Socioeconomic History  . Marital status: Married    Spouse name: Not on file  . Number of children: Not on file  . Years of education: Not on file  . Highest education level: Not on file  Occupational History  . Not on file  Social Needs  . Financial resource strain: Not on file  . Food insecurity:    Worry: Not on file    Inability: Not on file  . Transportation needs:    Medical: Not on file    Non-medical: Not on file  Tobacco Use  . Smoking status: Never Smoker  . Smokeless tobacco: Current User    Types: Snuff  Substance and Sexual Activity  . Alcohol use: No  . Drug use: No  . Sexual activity: Yes    Partners: Female  Lifestyle  . Physical activity:    Days per week: Not on file    Minutes per session: Not on file  . Stress: Not on file  Relationships  . Social connections:    Talks on phone: Not on file    Gets together: Not on file    Attends religious service: Not on file    Active member of club or organization: Not on file    Attends meetings of clubs or organizations: Not on file    Relationship status: Not on file  . Intimate partner violence:    Fear of current or ex partner: Not on file  Emotionally abused: Not on file    Physically abused: Not on file    Forced sexual activity: Not on file  Other Topics Concern  . Not on file  Social History Narrative  . Not on file    ROS  General:  Negative for nexplained weight loss, fever Skin: Negative for new or changing mole, sore that won't heal HEENT: Negative for trouble hearing, trouble seeing, ringing in ears, mouth sores, hoarseness, change in voice, dysphagia. CV:  Negative for chest pain, dyspnea, edema, palpitations Resp: Negative for cough, dyspnea, hemoptysis GI: Negative for nausea, vomiting, diarrhea, constipation, abdominal pain, melena, hematochezia. GU: Negative for dysuria, incontinence, urinary hesitance, hematuria, vaginal or penile discharge, polyuria, sexual difficulty, lumps  in testicle or breasts MSK: Negative for muscle cramps or aches, joint pain or swelling Neuro: Negative for headaches, weakness, numbness, dizziness, passing out/fainting Psych: Negative for depression, anxiety, memory problems  Objective  Physical Exam Vitals:   07/27/17 1418  BP: 120/80  Pulse: 66  Temp: 98.2 F (36.8 C)  SpO2: 98%    BP Readings from Last 3 Encounters:  07/27/17 120/80  04/20/17 (!) 140/100  10/16/16 128/88   Wt Readings from Last 3 Encounters:  07/27/17 286 lb 9.6 oz (130 kg)  04/20/17 295 lb 12.8 oz (134.2 kg)  10/16/16 292 lb (132.5 kg)    Physical Exam  Constitutional: No distress.  HENT:  Head: Normocephalic and atraumatic.  Mouth/Throat: Oropharynx is clear and moist.  Eyes: Pupils are equal, round, and reactive to light. Conjunctivae are normal.  Cardiovascular: Normal rate, regular rhythm and normal heart sounds.  Pulmonary/Chest: Effort normal and breath sounds normal.  Abdominal: Soft. Bowel sounds are normal. He exhibits no distension. There is no tenderness. There is no rebound and no guarding.  Musculoskeletal: He exhibits no edema.  Neurological: He is alert.  Skin: Skin is warm and dry. He is not diaphoretic.  Psychiatric: He has a normal mood and affect.     Assessment/Plan:   Routine general medical examination at a health care facility Physical exam completed.  Encouraged continued diet and exercise.  Patient interested in quitting dip.  Chantix sent to pharmacy as this has previously been discussed.  Discussed potential side effects related to this and reasons to discontinue the medication.  We will check lab work today.  We did confirm that his losartan was not part of the recent recalls. Offered referral to podiatry for his toenails.  Patient deferred and will let us know if he would like to do this.  Orders Placed This Encounter  Procedures  . Comp Met (CMET)  . HgB A1c    Meds ordered this encounter  Medications  .  varenicline (CHANTIX STARTING MONTH PAK) 0.5 MG X 11 & 1 MG X 42 tablet    Sig: Take one 0.5 mg tablet by mouth once daily for 3 days, then increase to one 0.5 mg tablet twice daily for 4 days, then increase to one 1 mg tablet twice daily.    Dispense:  53 tablet    Refill:  0     Bradley Rumps, MD [place]

## 2017-07-27 NOTE — Patient Instructions (Addendum)
Nice to see you. We will check some lab work today and contact you with the results. Please start the Chantix for tobacco cessation. Your losartan was not part of the recall.  You should continue this medication.

## 2018-02-01 ENCOUNTER — Encounter: Payer: Self-pay | Admitting: Family Medicine

## 2018-02-01 ENCOUNTER — Ambulatory Visit: Payer: BC Managed Care – PPO | Admitting: Family Medicine

## 2018-02-01 VITALS — BP 148/110 | HR 74 | Temp 98.2°F | Ht 73.0 in | Wt 294.6 lb

## 2018-02-01 DIAGNOSIS — E119 Type 2 diabetes mellitus without complications: Secondary | ICD-10-CM

## 2018-02-01 DIAGNOSIS — E782 Mixed hyperlipidemia: Secondary | ICD-10-CM

## 2018-02-01 DIAGNOSIS — I10 Essential (primary) hypertension: Secondary | ICD-10-CM

## 2018-02-01 NOTE — Patient Instructions (Signed)
Nice to see you. Please start checking your blood pressure daily at home for the next week.  We will have you return for a nurse visit to check this.  Please bring in your readings at that time.  If your blood pressure is elevated we will need to add additional medication.

## 2018-02-01 NOTE — Assessment & Plan Note (Addendum)
Elevated today though he has not been checking it at home.  Did discuss adding additional medication today though he opted to start checking at home and contact us in 1 week with his readings.  If elevated at home would consider starting additional medication.  Labs as outlined below.

## 2018-02-01 NOTE — Assessment & Plan Note (Signed)
Check A1c.  Continue metformin. 

## 2018-02-01 NOTE — Assessment & Plan Note (Signed)
Check lipid panel.  Continue Crestor. 

## 2018-02-01 NOTE — Progress Notes (Signed)
  Tommi Rumps, MD Phone: [phone]  Bradley Stevens is a 47 y.o. male who presents today for f/u.  CC: htn, dm, hld  HYPERTENSION Disease Monitoring: Blood pressure range-not checking Chest pain- no      Dyspnea- no Medications: Compliance- taking losartan   Edema- no  DIABETES Disease Monitoring: Blood Sugar ranges-not checking Polyuria/phagia/dipsia- no      Optho- UTD, requesting records Medications: Compliance- taking metformin Hypoglycemic symptoms- no  HYPERLIPIDEMIA Disease Monitoring: See symptoms for Hypertension Medications: Compliance- taking crestor Right upper quadrant pain- no  Muscle aches- no     Social History   Tobacco Use  Smoking Status Never Smoker  Smokeless Tobacco Current User  . Types: Snuff     ROS see history of present illness  Objective  Physical Exam Vitals:   02/01/18 1443 02/01/18 1514  BP: (!) 150/100 (!) 148/110  Pulse: 74   Temp: 98.2 F (36.8 C)   SpO2: 98%     BP Readings from Last 3 Encounters:  02/01/18 (!) 148/110  07/27/17 120/80  04/20/17 (!) 140/100   Wt Readings from Last 3 Encounters:  02/01/18 294 lb 9.6 oz (133.6 kg)  07/27/17 286 lb 9.6 oz (130 kg)  04/20/17 295 lb 12.8 oz (134.2 kg)    Physical Exam  Constitutional: No distress.  Cardiovascular: Normal rate, regular rhythm and normal heart sounds.  Pulmonary/Chest: Effort normal and breath sounds normal.  Musculoskeletal: He exhibits no edema.  Neurological: He is alert.  Skin: Skin is warm and dry. He is not diaphoretic.     Assessment/Plan: Please see individual problem list.  DM (diabetes mellitus), type 2 ([facility]) Check A1c.  Continue metformin.  Mixed hyperlipidemia Check lipid panel.  Continue Crestor.  Essential hypertension Elevated today though he has not been checking it at home.  Did discuss adding additional medication today though he opted to start checking at home and contact us in 1 week with his readings.  If elevated  at home would consider starting additional medication.  Labs as outlined below.    Orders Placed This Encounter  Procedures  . Lipid panel  . HgB A1c  . Comp Met (CMET)    No orders of the defined types were placed in this encounter.    Tommi Rumps, MD [place]

## 2018-02-02 LAB — HEMOGLOBIN A1C
EAG (MMOL/L): 9.3 (calc)
Hgb A1c MFr Bld: 7.5 % of total Hgb — ABNORMAL HIGH (ref <5.7)
Mean Plasma Glucose: 169 (calc)

## 2018-02-02 LAB — COMPREHENSIVE METABOLIC PANEL
AG RATIO: 1.7 (calc) (ref 1.0–2.5)
ALT: 14 U/L (ref 9–46)
AST: 16 U/L (ref 10–40)
Albumin: 4.3 g/dL (ref 3.6–5.1)
Alkaline phosphatase (APISO): 76 U/L (ref 40–115)
BUN: 11 mg/dL (ref 7–25)
CHLORIDE: 99 mmol/L (ref 98–110)
CO2: 30 mmol/L (ref 20–32)
CREATININE: 1.01 mg/dL (ref 0.60–1.35)
Calcium: 9.6 mg/dL (ref 8.6–10.3)
GLOBULIN: 2.5 g/dL (ref 1.9–3.7)
GLUCOSE: 93 mg/dL (ref 65–99)
POTASSIUM: 4.5 mmol/L (ref 3.5–5.3)
Sodium: 137 mmol/L (ref 135–146)
Total Bilirubin: 0.4 mg/dL (ref 0.2–1.2)
Total Protein: 6.8 g/dL (ref 6.1–8.1)

## 2018-02-02 LAB — LIPID PANEL
CHOL/HDL RATIO: 2.8 (calc) (ref <5.0)
CHOLESTEROL: 99 mg/dL (ref <200)
HDL: 35 mg/dL — ABNORMAL LOW (ref >40)
LDL CHOLESTEROL (CALC): 46 mg/dL
Non-HDL Cholesterol (Calc): 64 mg/dL (calc) (ref <130)
Triglycerides: 99 mg/dL (ref <150)

## 2018-02-04 ENCOUNTER — Other Ambulatory Visit: Payer: Self-pay | Admitting: Family Medicine

## 2018-02-04 MED ORDER — METFORMIN HCL 1000 MG PO TABS: 1000.0000 mg | ORAL_TABLET | Freq: Two times a day (BID) | ORAL | 1 refills | Status: DC

## 2018-02-10 ENCOUNTER — Encounter: Payer: Self-pay | Admitting: Family Medicine

## 2018-02-12 MED ORDER — AMLODIPINE BESYLATE 5 MG PO TABS: 5.0000 mg | ORAL_TABLET | Freq: Every day | ORAL | 3 refills | Status: DC

## 2018-05-21 LAB — HM DIABETES EYE EXAM

## 2018-06-07 ENCOUNTER — Ambulatory Visit: Payer: BC Managed Care – PPO | Admitting: Family Medicine

## 2018-06-12 ENCOUNTER — Other Ambulatory Visit: Payer: Self-pay

## 2018-06-12 ENCOUNTER — Ambulatory Visit: Payer: BC Managed Care – PPO | Admitting: Family Medicine

## 2018-06-12 ENCOUNTER — Encounter: Payer: Self-pay | Admitting: Family Medicine

## 2018-06-12 VITALS — BP 118/84 | HR 70 | Temp 98.7°F | Ht 73.0 in | Wt 286.8 lb

## 2018-06-12 DIAGNOSIS — R252 Cramp and spasm: Secondary | ICD-10-CM

## 2018-06-12 DIAGNOSIS — E782 Mixed hyperlipidemia: Secondary | ICD-10-CM

## 2018-06-12 DIAGNOSIS — E119 Type 2 diabetes mellitus without complications: Secondary | ICD-10-CM | POA: Diagnosis not present

## 2018-06-12 DIAGNOSIS — L720 Epidermal cyst: Secondary | ICD-10-CM

## 2018-06-12 DIAGNOSIS — M79672 Pain in left foot: Secondary | ICD-10-CM

## 2018-06-12 DIAGNOSIS — I10 Essential (primary) hypertension: Secondary | ICD-10-CM

## 2018-06-12 DIAGNOSIS — B356 Tinea cruris: Secondary | ICD-10-CM

## 2018-06-12 MED ORDER — KETOCONAZOLE 2 % EX CREA: 1.0000 "application " | TOPICAL_CREAM | Freq: Every day | CUTANEOUS | 0 refills | Status: DC

## 2018-06-12 NOTE — Patient Instructions (Signed)
Nice to see you. We will check lab work today and contact you with the results. If you would like to see a dermatologist or a podiatrist please let us know. Please use the ketoconazole cream for your jock itch.

## 2018-06-12 NOTE — Progress Notes (Signed)
[name], MD Phone: [phone]  Bradley Stevens is a 48 y.o. male who presents today for follow-up.  CC: Hypertension, diabetes, hyperlipidemia, leg cramps, epidermal cyst, left foot pain  Hypertension: Notes his blood pressures are good at home.  They have come down some as he has started exercising.  He is going to the gym and doing circuit training and cardio.  Taking amlodipine and losartan.  No chest pain, shortness of breath, or edema.  Diabetes: Not checking sugars.  He is taking Metformin.  No polyuria or polydipsia.  No hypoglycemia.  Hyperlipidemia: Taking Crestor.  No abdominal pain or myalgias.  Leg cramps: He does note cramps started about a week ago.  Occur mostly at night and in his thighs.  He will get out of bed and stretch and it will improve.  He is trying to stay hydrated while exercising.  Epidermal cyst: Notes he has these on his back.  He saw dermatology and notes they advised him not to do anything about them currently.  He wonders about seeing somebody to have them taken out.  Left foot pain: This started about a week ago.  He notes no specific injury.  Notes it hurt over the tendinous area for his great toe and spread dorsally to his middle toes.  He feels as though it may be a muscular strain.  Jock itch: The patient additionally notes some jock itch in his right groin area.  He wonders what the best treatment for this would be.  Social History   Tobacco Use  Smoking Status Never Smoker  Smokeless Tobacco Current User  . Types: Snuff     ROS see history of present illness  Objective  Physical Exam Vitals:   06/12/18 1535  BP: 118/84  Pulse: 70  Temp: 98.7 F (37.1 C)  SpO2: 97%    BP Readings from Last 3 Encounters:  06/12/18 118/84  02/01/18 (!) 148/110  07/27/17 120/80   Wt Readings from Last 3 Encounters:  06/12/18 286 lb 12.8 oz (130.1 kg)  02/01/18 294 lb 9.6 oz (133.6 kg)  07/27/17 286 lb 9.6 oz (130 kg)    Physical  Exam Constitutional:      General: He is not in acute distress.    Appearance: He is not diaphoretic.  Cardiovascular:     Rate and Rhythm: Normal rate and regular rhythm.     Heart sounds: Normal heart sounds.  Pulmonary:     Effort: Pulmonary effort is normal.     Breath sounds: Normal breath sounds.  Musculoskeletal:     Comments: Dorsum of left foot over MTP joints is slightly puffy, there is no tenderness in this area, there is no erythema  Skin:    General: Skin is warm and dry.       Neurological:     Mental Status: He is alert.      Assessment/Plan: Please see individual problem list.  Essential hypertension Well-controlled.  Continue current regimen.  DM (diabetes mellitus), type 2 ([facility]) Check BMP and A1c.  Continue current regimen.  Mixed hyperlipidemia Continue Crestor.  Cramps, extremity I suspect this is related to his recent increase in activity.  Possibly related to some dehydration.  Encouraged adequate hydration.  We will evaluate electrolytes.  Encourage stretching.  Left foot pain Suspect tendinous strain.  Discussed icing.  Offered referral to podiatry though he deferred.  If not improving he will let us know.  Epidermal cyst Most likely cysts on his back though could  be lipomas.  Offered referral to a different dermatologist versus a surgeon for removal.  He noted he would consider this.  Jock itch Possible jock itch in right groin.  Will treat with ketoconazole cream.  If not improving he will let us know.    Orders Placed This Encounter  Procedures  . Basic Metabolic Panel (BMET)  . HgB A1c    Meds ordered this encounter  Medications  . ketoconazole (NIZORAL) 2 % cream    Sig: Apply 1 application topically daily.    Dispense:  15 g    Refill:  0     [name], MD [facility]

## 2018-06-13 LAB — BASIC METABOLIC PANEL
BUN: 10 mg/dL (ref 6–23)
CALCIUM: 9.2 mg/dL (ref 8.4–10.5)
CO2: 24 mEq/L (ref 19–32)
Chloride: 100 mEq/L (ref 96–112)
Creatinine, Ser: 0.95 mg/dL (ref 0.40–1.50)
GFR: 84.25 mL/min (ref >60.00)
GLUCOSE: 94 mg/dL (ref 70–99)
Potassium: 3.7 mEq/L (ref 3.5–5.1)
SODIUM: 137 meq/L (ref 135–145)

## 2018-06-13 LAB — HEMOGLOBIN A1C: HEMOGLOBIN A1C: 7 % — AB (ref 4.6–6.5)

## 2018-06-16 DIAGNOSIS — M79672 Pain in left foot: Secondary | ICD-10-CM | POA: Insufficient documentation

## 2018-06-16 DIAGNOSIS — B356 Tinea cruris: Secondary | ICD-10-CM | POA: Insufficient documentation

## 2018-06-16 DIAGNOSIS — R252 Cramp and spasm: Secondary | ICD-10-CM | POA: Insufficient documentation

## 2018-06-16 DIAGNOSIS — L720 Epidermal cyst: Secondary | ICD-10-CM | POA: Insufficient documentation

## 2018-06-16 NOTE — Assessment & Plan Note (Signed)
Continue Crestor 

## 2018-06-16 NOTE — Assessment & Plan Note (Signed)
Suspect tendinous strain.  Discussed icing.  Offered referral to podiatry though he deferred.  If not improving he will let us know.

## 2018-06-16 NOTE — Assessment & Plan Note (Signed)
I suspect this is related to his recent increase in activity.  Possibly related to some dehydration.  Encouraged adequate hydration.  We will evaluate electrolytes.  Encourage stretching.

## 2018-06-16 NOTE — Assessment & Plan Note (Signed)
Well-controlled.  Continue current regimen. 

## 2018-06-16 NOTE — Assessment & Plan Note (Signed)
Possible jock itch in right groin.  Will treat with ketoconazole cream.  If not improving he will let us know.

## 2018-06-16 NOTE — Assessment & Plan Note (Signed)
Most likely cysts on his back though could be lipomas.  Offered referral to a different dermatologist versus a surgeon for removal.  He noted he would consider this.

## 2018-06-16 NOTE — Assessment & Plan Note (Signed)
Check BMP and A1c.  Continue current regimen.

## 2018-07-20 ENCOUNTER — Other Ambulatory Visit: Payer: Self-pay | Admitting: Family Medicine

## 2018-07-20 DIAGNOSIS — E782 Mixed hyperlipidemia: Secondary | ICD-10-CM

## 2018-08-09 ENCOUNTER — Other Ambulatory Visit: Payer: Self-pay | Admitting: Family Medicine

## 2018-10-11 ENCOUNTER — Other Ambulatory Visit: Payer: Self-pay

## 2018-10-15 ENCOUNTER — Encounter: Payer: BC Managed Care – PPO | Admitting: Family Medicine

## 2018-12-03 ENCOUNTER — Other Ambulatory Visit: Payer: Self-pay | Admitting: Family Medicine

## 2018-12-10 ENCOUNTER — Encounter: Payer: BC Managed Care – PPO | Admitting: Family Medicine

## 2018-12-11 ENCOUNTER — Other Ambulatory Visit: Payer: Self-pay

## 2018-12-11 ENCOUNTER — Encounter: Payer: Self-pay | Admitting: Family Medicine

## 2018-12-11 ENCOUNTER — Ambulatory Visit (INDEPENDENT_AMBULATORY_CARE_PROVIDER_SITE_OTHER): Payer: BC Managed Care – PPO | Admitting: Family Medicine

## 2018-12-11 VITALS — BP 110/70 | HR 73 | Temp 98.4°F | Ht 74.0 in | Wt 280.4 lb

## 2018-12-11 DIAGNOSIS — Z23 Encounter for immunization: Secondary | ICD-10-CM | POA: Diagnosis not present

## 2018-12-11 DIAGNOSIS — E119 Type 2 diabetes mellitus without complications: Secondary | ICD-10-CM

## 2018-12-11 DIAGNOSIS — Z0001 Encounter for general adult medical examination with abnormal findings: Secondary | ICD-10-CM | POA: Diagnosis not present

## 2018-12-11 DIAGNOSIS — R42 Dizziness and giddiness: Secondary | ICD-10-CM | POA: Insufficient documentation

## 2018-12-11 DIAGNOSIS — Z720 Tobacco use: Secondary | ICD-10-CM | POA: Diagnosis not present

## 2018-12-11 DIAGNOSIS — F419 Anxiety disorder, unspecified: Secondary | ICD-10-CM | POA: Diagnosis not present

## 2018-12-11 DIAGNOSIS — E782 Mixed hyperlipidemia: Secondary | ICD-10-CM

## 2018-12-11 DIAGNOSIS — S39012A Strain of muscle, fascia and tendon of lower back, initial encounter: Secondary | ICD-10-CM | POA: Insufficient documentation

## 2018-12-11 DIAGNOSIS — F329 Major depressive disorder, single episode, unspecified: Secondary | ICD-10-CM | POA: Insufficient documentation

## 2018-12-11 DIAGNOSIS — N529 Male erectile dysfunction, unspecified: Secondary | ICD-10-CM | POA: Insufficient documentation

## 2018-12-11 MED ORDER — BUPROPION HCL ER (XL) 150 MG PO TB24: 150.0000 mg | ORAL_TABLET | Freq: Every day | ORAL | 3 refills | Status: DC

## 2018-12-11 MED ORDER — CYCLOBENZAPRINE HCL 10 MG PO TABS: 10.0000 mg | ORAL_TABLET | Freq: Three times a day (TID) | ORAL | 0 refills | Status: DC | PRN

## 2018-12-11 NOTE — Assessment & Plan Note (Signed)
Physical exam completed.  Encouraged to try to use as healthfully as possible and stay as active as he can. Pneumonia vaccine given.  Flu vaccine given. Tobacco abuse counseling given.  Please see below. Lab work as outlined below.

## 2018-12-11 NOTE — Assessment & Plan Note (Signed)
Upper back strain has been improving.  Trial of Flexeril.  Discussed risk of drowsiness with this and to not drive if he becomes drowsy.

## 2018-12-11 NOTE — Patient Instructions (Signed)
Nice to see you. We will check lab work today and contact you with the results. Please try to Flexeril for your back.  This could potentially make you drowsy so please be wary of this and do not drive if you are drowsy with this. Please try to stay well-hydrated. We will start you on Wellbutrin to help with tobacco use cessation and for the depression and anxiety.  It could take up to 2 months for you to notice a big difference with the depression and anxiety so please be aware of this. If you would like to try medication for the erectile issues please let us know.

## 2018-12-11 NOTE — Assessment & Plan Note (Signed)
New onset issue this year.  This has a lot to do with the loss of his mother and family issues as well as COVID-19.  We will trial him on Wellbutrin.  We will see him back in 3 months for follow-up on this.

## 2018-12-11 NOTE — Assessment & Plan Note (Addendum)
Smoking cessation counseling was provided.  Approximately 4 minutes were spent discussing the rationale for tobacco cessation and strategies for doing so.  Adjuncts, including nicotine patches, nicotine lozenges and buproprion were recommended. Follow-up in 3 months regarding this.

## 2018-12-11 NOTE — Progress Notes (Addendum)
Tommi Rumps, MD Phone: [phone]  Bradley Stevens is a 48 y.o. male who presents today for CPE.  Exercise: He stays active with his children though does not have any specific time for exercise currently. Diet: Typically will fix his own meals.  Eats sandwiches, steak and potatoes, he does like vegetables. No family history of prostate cancer or colon cancer. Due for pneumonia vaccine.  Flu vaccine given today.  Tetanus vaccine up-to-date. Tobacco abuse: Patient has dipped for a long time.  He has a desire to quit.  Chantix was not helpful.  He notes it is more of a flavor thing and has been using nicotine replacement patches for this.  He would like to try medication in addition to nicotine replacement for this.  No alcohol use.  No illicit drug use. He sees a Pharmacist, community and an ophthalmologist.  Lightheadedness: He notes this occurred on one occasion recently where he was bending over building a walkway and then lifting up and he would get lightheaded.  No syncope.  No recurrence.  He was outside in the heat when this was occurring.  Depression/anxiety/stress: Patient notes he has been dealing with this for most of this year.  His mother passed away earlier this year.  He has been dealing with her estate and his siblings.  The COVID-19 pandemic has not been helpful either.  He is not able to do much of anything for himself to help with his mental status.  He denies SI and HI.  Erectile dysfunction: Patient notes he has had issues maintaining erections for the last year and a half.  He is able to get an erection adequately though once he lays down the erection will go away.  He is not able to get to the point of ejaculating.  He has no pain with erections.  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  Back strain: Patient notes 2 weeks ago he strained his upper back and noticed the  symptoms after he woke up.  He notes no specific injury.  He notes it is progressively improving though it does make it difficult for him to sleep at night.  He notes no radiation of the pain.    Active Ambulatory Problems    Diagnosis Date Noted  . Obesity (BMI 30-39.9) 06/26/2016  . Encounter for general adult medical examination with abnormal findings 06/26/2016  . Essential hypertension 06/26/2016  . DM (diabetes mellitus), type 2 ([place]) 07/17/2016  . Mixed hyperlipidemia 07/17/2016  . Achrochordon 04/20/2017  . Tobacco abuse 04/20/2017  . Cramps, extremity 06/16/2018  . Left foot pain 06/16/2018  . Epidermal cyst 06/16/2018  . Jock itch 06/16/2018  . Anxiety and depression 12/11/2018  . Erectile dysfunction 12/11/2018  . Back strain 12/11/2018  . Lightheadedness 12/11/2018   Resolved Ambulatory Problems    Diagnosis Date Noted  . No Resolved Ambulatory Problems   Past Medical History:  Diagnosis Date  . UTI (urinary tract infection)     Family History  Problem Relation Age of Onset  . Arthritis Mother   . Breast cancer Mother   . Hyperlipidemia Mother   . Heart disease Mother   . Hypertension Mother   . Mental illness Mother   . Lung cancer Father   . Arthritis Maternal Grandmother   . Stroke Maternal Grandmother   . Mental illness Maternal Grandmother   . Alcohol abuse  Maternal Grandfather   . Arthritis Maternal Grandfather   . Hyperlipidemia Maternal Grandfather   . Heart disease Maternal Grandfather   . Hypertension Maternal Grandfather   . Diabetes Maternal Grandfather   . Arthritis Paternal Grandmother   . Diabetes Paternal Grandmother   . Arthritis Paternal Grandfather   . Lung cancer Paternal Grandfather     Social History   Socioeconomic History  . Marital status: Married    Spouse name: Not on file  . Number of children: Not on file  . Years of education: Not on file  . Highest education level: Not on file  Occupational History  . Not on  file  Social Needs  . Financial resource strain: Not on file  . Food insecurity    Worry: Not on file    Inability: Not on file  . Transportation needs    Medical: Not on file    Non-medical: Not on file  Tobacco Use  . Smoking status: Never Smoker  . Smokeless tobacco: Current User    Types: Snuff  Substance and Sexual Activity  . Alcohol use: No  . Drug use: No  . Sexual activity: Yes    Partners: Female  Lifestyle  . Physical activity    Days per week: Not on file    Minutes per session: Not on file  . Stress: Not on file  Relationships  . Social Herbalist on phone: Not on file    Gets together: Not on file    Attends religious service: Not on file    Active member of club or organization: Not on file    Attends meetings of clubs or organizations: Not on file    Relationship status: Not on file  . Intimate partner violence    Fear of current or ex partner: Not on file    Emotionally abused: Not on file    Physically abused: Not on file    Forced sexual activity: Not on file  Other Topics Concern  . Not on file  Social History Narrative  . Not on file    ROS  General:  Negative for nexplained weight loss, fever Skin: Negative for new or changing mole, sore that won't heal HEENT: Negative for trouble hearing, trouble seeing, ringing in ears, mouth sores, hoarseness, change in voice, dysphagia. CV:  Negative for chest pain, dyspnea, edema, palpitations Resp: Negative for cough, dyspnea, hemoptysis GI: Negative for nausea, vomiting, diarrhea, constipation, abdominal pain, melena, hematochezia. GU: Erectile dysfunction, negative for dysuria, incontinence, urinary hesitance, hematuria, vaginal or penile discharge, polyuria, lumps in testicle or breasts MSK: Positive for muscle cramps or aches, negative for joint pain or swelling Neuro: Positive for lightheadedness, negative for headaches, weakness, numbness, passing out/fainting Psych: Positive for  depression, anxiety, negative for memory problems  Objective  Physical Exam Vitals:   12/11/18 1504  BP: 110/70  Pulse: 73  Temp: 98.4 F (36.9 C)  SpO2: 98%    Blood pressure 110/70 Sitting blood pressure 120/80 Standing blood pressure 130/80  BP Readings from Last 3 Encounters:  12/11/18 110/70  06/12/18 118/84  02/01/18 (!) 148/110   Wt Readings from Last 3 Encounters:  12/11/18 280 lb 6.4 oz (127.2 kg)  06/12/18 286 lb 12.8 oz (130.1 kg)  02/01/18 294 lb 9.6 oz (133.6 kg)    Physical Exam Constitutional:      General: He is not in acute distress.    Appearance: He is not diaphoretic.  HENT:  Head: Normocephalic and atraumatic.  Eyes:     Conjunctiva/sclera: Conjunctivae normal.     Pupils: Pupils are equal, round, and reactive to light.  Cardiovascular:     Rate and Rhythm: Normal rate and regular rhythm.     Heart sounds: Normal heart sounds.  Pulmonary:     Effort: Pulmonary effort is normal.     Breath sounds: Normal breath sounds.  Abdominal:     General: Bowel sounds are normal. There is no distension.     Palpations: Abdomen is soft.     Tenderness: There is no abdominal tenderness. There is no guarding or rebound.  Genitourinary:    Comments: Circumcised penis, there is a slight web at the superior portion of the urethral meatus, otherwise normal penis, normal scrotum, normal testicles, normal epididymis, normal vas deferens, no inguinal hernias Musculoskeletal:     Right lower leg: No edema.     Left lower leg: No edema.     Comments: No midline spine tenderness, no midline spine step-off, mild muscular tenderness in the thoracic paraspinous muscles  Lymphadenopathy:     Cervical: No cervical adenopathy.  Skin:    General: Skin is warm and dry.  Neurological:     Mental Status: He is alert.  Psychiatric:     Comments: Mood depressed, anxious, affect flat      Assessment/Plan:   Encounter for general adult medical examination with  abnormal findings Physical exam completed.  Encouraged to try to use as healthfully as possible and stay as active as he can. Pneumonia vaccine given.  Flu vaccine given. Tobacco abuse counseling given.  Please see below. Lab work as outlined below.  Tobacco abuse Smoking cessation counseling was provided.  Approximately 4 minutes were spent discussing the rationale for tobacco cessation and strategies for doing so.  Adjuncts, including nicotine patches, nicotine lozenges and buproprion were recommended. Follow-up in 3 months regarding this.    Anxiety and depression New onset issue this year.  This has a lot to do with the loss of his mother and family issues as well as COVID-19.  We will trial him on Wellbutrin.  We will see him back in 3 months for follow-up on this.  Erectile dysfunction This could be a vasculogenic cause given his risk factors though anxiety and depression could be playing a role as well.  Offered medication for this though he deferred.  We will see how he does on Wellbutrin for treatment of his anxiety and depression and then go from there.  Back strain Upper back strain has been improving.  Trial of Flexeril.  Discussed risk of drowsiness with this and to not drive if he becomes drowsy.  Lightheadedness Single episode seems to be orthostatic in nature.  Encouraged hydration.  He will monitor for recurrence.   Orders Placed This Encounter  Procedures  . Flu Vaccine QUAD 36+ mos IM  . Pneumococcal polysaccharide vaccine 23-valent greater than or equal to 2yo subcutaneous/IM  . Comp Met (CMET)  . HgB A1c  . Direct LDL    Meds ordered this encounter  Medications  . cyclobenzaprine (FLEXERIL) 10 MG tablet    Sig: Take 1 tablet (10 mg total) by mouth 3 (three) times daily as needed for muscle spasms.    Dispense:  10 tablet    Refill:  0  . buPROPion (WELLBUTRIN XL) 150 MG 24 hr tablet    Sig: Take 1 tablet (150 mg total) by mouth daily.    Dispense:  30  tablet     Refill:  3     Tommi Rumps, MD [place]

## 2018-12-11 NOTE — Assessment & Plan Note (Signed)
This could be a vasculogenic cause given his risk factors though anxiety and depression could be playing a role as well.  Offered medication for this though he deferred.  We will see how he does on Wellbutrin for treatment of his anxiety and depression and then go from there.

## 2018-12-11 NOTE — Assessment & Plan Note (Signed)
Single episode seems to be orthostatic in nature.  Encouraged hydration.  He will monitor for recurrence.

## 2018-12-12 LAB — COMPREHENSIVE METABOLIC PANEL
ALT: 15 U/L (ref 0–53)
AST: 12 U/L (ref 0–37)
Albumin: 4.3 g/dL (ref 3.5–5.2)
Alkaline Phosphatase: 58 U/L (ref 39–117)
BUN: 14 mg/dL (ref 6–23)
CO2: 29 mEq/L (ref 19–32)
Calcium: 9.8 mg/dL (ref 8.4–10.5)
Chloride: 101 mEq/L (ref 96–112)
Creatinine, Ser: 0.96 mg/dL (ref 0.40–1.50)
GFR: 83.07 mL/min (ref >60.00)
Glucose, Bld: 130 mg/dL — ABNORMAL HIGH (ref 70–99)
Potassium: 3.8 mEq/L (ref 3.5–5.1)
Sodium: 139 mEq/L (ref 135–145)
Total Bilirubin: 0.5 mg/dL (ref 0.2–1.2)
Total Protein: 6.5 g/dL (ref 6.0–8.3)

## 2018-12-12 LAB — LDL CHOLESTEROL, DIRECT: Direct LDL: 38 mg/dL

## 2018-12-12 LAB — HEMOGLOBIN A1C: Hgb A1c MFr Bld: 7.2 % — ABNORMAL HIGH (ref 4.6–6.5)

## 2019-01-02 ENCOUNTER — Other Ambulatory Visit: Payer: Self-pay | Admitting: Family Medicine

## 2019-02-05 ENCOUNTER — Other Ambulatory Visit: Payer: Self-pay | Admitting: Family Medicine

## 2019-03-18 ENCOUNTER — Ambulatory Visit (INDEPENDENT_AMBULATORY_CARE_PROVIDER_SITE_OTHER): Payer: BC Managed Care – PPO | Admitting: Family Medicine

## 2019-03-18 ENCOUNTER — Encounter: Payer: Self-pay | Admitting: Family Medicine

## 2019-03-18 ENCOUNTER — Other Ambulatory Visit: Payer: Self-pay

## 2019-03-18 DIAGNOSIS — M25521 Pain in right elbow: Secondary | ICD-10-CM | POA: Diagnosis not present

## 2019-03-18 DIAGNOSIS — E119 Type 2 diabetes mellitus without complications: Secondary | ICD-10-CM

## 2019-03-18 DIAGNOSIS — F419 Anxiety disorder, unspecified: Secondary | ICD-10-CM | POA: Diagnosis not present

## 2019-03-18 DIAGNOSIS — Z720 Tobacco use: Secondary | ICD-10-CM | POA: Diagnosis not present

## 2019-03-18 DIAGNOSIS — F329 Major depressive disorder, single episode, unspecified: Secondary | ICD-10-CM

## 2019-03-18 DIAGNOSIS — F32A Depression, unspecified: Secondary | ICD-10-CM

## 2019-03-18 NOTE — Assessment & Plan Note (Signed)
Some improvement with Wellbutrin.  He will continue this.

## 2019-03-18 NOTE — Assessment & Plan Note (Signed)
Borderline control previously.  We discussed adding in [place].  I will send him information on this and he will consider taking this.  Plan for lab work in 3 to 20-month at his next follow-up.  Labs deferred by me given the COVID-19 pandemic.

## 2019-03-18 NOTE — Progress Notes (Signed)
Virtual Visit via video Note  This visit type was conducted due to national recommendations for restrictions regarding the COVID-19 pandemic (e.g. social distancing).  This format is felt to be most appropriate for this patient at this time.  All issues noted in this document were discussed and addressed.  No physical exam was performed (except for noted visual exam findings with Video Visits).   I connected with Bradley Stevens today at  8:00 AM EST by a video enabled telemedicine application and verified that I am speaking with the correct person using two identifiers. Location patient: work Location provider: work Persons participating in the virtual visit: patient, provider  I discussed the limitations, risks, security and privacy concerns of performing an evaluation and management service by telephone and the availability of in person appointments. I also discussed with the patient that there may be a patient responsible charge related to this service. The patient expressed understanding and agreed to proceed.   Reason for visit: follow-up  HPI: Right elbow pain: Right elbow pain started 3 weeks ago.  He woke up with it hurting.  Notes slight swelling as well.  He notes in the morning its relatively okay though progressively throughout the day the tendons and muscles in the forearm start to hurt.  It is possibly slightly tender near the epicondyle.  He notes holding his arm out straight and making a fist is bothersome.  Ibuprofen and Tylenol has not been beneficial.  He has been using a tennis elbow brace which has helped some.  Anxiety/depression: The patient notes his anxiety is okay.  He notes no more depression than usual.  Wellbutrin has been somewhat beneficial.  Notes no SI.  Diabetes: Not checking his sugars.  Taking metformin.  He notes no issues with this.  He notes the last couple of nights he has had some polyuria though previously no polyuria or polydipsia.  No  hypoglycemia.  Tobacco abuse: He continues to use tobacco products.  He goes through a can of tobacco every 2-2.5 days.  That has decreased from a can every 1 to 1.5 days.  Wellbutrin has been beneficial to a certain degree.   ROS: See pertinent positives and negatives per HPI.  Past Medical History:  Diagnosis Date  . UTI (urinary tract infection)     Past Surgical History:  Procedure Laterality Date  . NO PAST SURGERIES      Family History  Problem Relation Age of Onset  . Arthritis Mother   . Breast cancer Mother   . Hyperlipidemia Mother   . Heart disease Mother   . Hypertension Mother   . Mental illness Mother   . Lung cancer Father   . Arthritis Maternal Grandmother   . Stroke Maternal Grandmother   . Mental illness Maternal Grandmother   . Alcohol abuse Maternal Grandfather   . Arthritis Maternal Grandfather   . Hyperlipidemia Maternal Grandfather   . Heart disease Maternal Grandfather   . Hypertension Maternal Grandfather   . Diabetes Maternal Grandfather   . Arthritis Paternal Grandmother   . Diabetes Paternal Grandmother   . Arthritis Paternal Grandfather   . Lung cancer Paternal Grandfather     SOCIAL HX: Tobacco user   Current Outpatient Medications:  .  amLODipine (NORVASC) 5 MG tablet, TAKE 1 TABLET BY MOUTH EVERY DAY, Disp: 90 tablet, Rfl: 3 .  aspirin EC 81 MG tablet, Take 81 mg by mouth daily., Disp: , Rfl:  .  buPROPion (WELLBUTRIN XL) 150 MG 24  hr tablet, TAKE 1 TABLET BY MOUTH EVERY DAY, Disp: 90 tablet, Rfl: 2 .  cyclobenzaprine (FLEXERIL) 10 MG tablet, Take 1 tablet (10 mg total) by mouth 3 (three) times daily as needed for muscle spasms., Disp: 10 tablet, Rfl: 0 .  ketoconazole (NIZORAL) 2 % cream, Apply 1 application topically daily., Disp: 15 g, Rfl: 0 .  losartan (COZAAR) 100 MG tablet, TAKE 1 TABLET BY MOUTH EVERY DAY, Disp: 90 tablet, Rfl: 3 .  metFORMIN (GLUCOPHAGE) 1000 MG tablet, TAKE 1 TABLET (1,000 MG TOTAL) BY MOUTH 2 (TWO) TIMES  DAILY WITH A MEAL., Disp: 180 tablet, Rfl: 1 .  rosuvastatin (CRESTOR) 20 MG tablet, TAKE 1 TABLET BY MOUTH EVERY DAY, Disp: 90 tablet, Rfl: 3  EXAM:  VITALS per patient if applicable: None  GENERAL: alert, oriented, appears well and in no acute distress  HEENT: atraumatic, conjunttiva clear, no obvious abnormalities on inspection of external nose and ears  NECK: normal movements of the head and neck  LUNGS: on inspection no signs of respiratory distress, breathing rate appears normal, no obvious gross SOB, gasping or wheezing  CV: no obvious cyanosis  MS: moves all visible extremities without noticeable abnormality  PSYCH/NEURO: pleasant and cooperative, no obvious depression or anxiety, speech and thought processing grossly intact  ASSESSMENT AND PLAN:  Discussed the following assessment and plan:  DM (diabetes mellitus), type 2 ([facility]) Borderline control previously.  We discussed adding in [place].  I will send him information on this and he will consider taking this.  Plan for lab work in 3 to 40-month at his next follow-up.  Labs deferred by me given the COVID-19 pandemic.  Right elbow pain Seems consistent with lateral epicondylitis.  Discussed continued use of tennis elbow brace.  Discussed icing 2-3 times a day for 10 to 15 minutes at a time.  Discussed using a frozen bag of peas.  Advised not to ice for too long given risk of nerve damage.  If not improving over the next couple weeks would consider x-ray and orthopedic referral.  Tobacco abuse Encouraged continued cutting down on tobacco use.  We will continue Wellbutrin.  Anxiety and depression Some improvement with Wellbutrin.  He will continue this.    I discussed the assessment and treatment plan with the patient. The patient was provided an opportunity to ask questions and all were answered. The patient agreed with the plan and demonstrated an understanding of the instructions.   The patient was advised to call  back or seek an in-person evaluation if the symptoms worsen or if the condition fails to improve as anticipated.   Tommi Rumps, MD

## 2019-03-18 NOTE — Assessment & Plan Note (Signed)
Seems consistent with lateral epicondylitis.  Discussed continued use of tennis elbow brace.  Discussed icing 2-3 times a day for 10 to 15 minutes at a time.  Discussed using a frozen bag of peas.  Advised not to ice for too long given risk of nerve damage.  If not improving over the next couple weeks would consider x-ray and orthopedic referral.

## 2019-03-18 NOTE — Assessment & Plan Note (Signed)
Encouraged continued cutting down on tobacco use.  We will continue Wellbutrin.

## 2019-03-18 NOTE — Patient Instructions (Signed)
Empagliflozin oral tablets What is this medicine? EMPAGLIFLOZIN (EM pa gli FLOE zin) helps to treat type 2 diabetes. It helps to control blood sugar. This drug may also reduce the risk of heart attack or stroke if you have type 2 diabetes and risk factors for heart disease. Treatment is combined with diet and exercise. This medicine may be used for other purposes; ask your health care provider or pharmacist if you have questions. COMMON BRAND NAME(S): JARDIANCE What should I tell my health care provider before I take this medicine? They need to know if you have any of these conditions:  dehydration  diabetic ketoacidosis  diet low in salt  eating less due to illness, surgery, dieting, or any other reason  having surgery  high cholesterol  high levels of potassium in the blood  history of pancreatitis or pancreas problems  history of yeast infection of the penis or vagina  if you often drink alcohol  infections in the bladder, kidneys, or urinary tract  kidney disease  liver disease  low blood pressure  on hemodialysis  problems urinating  type 1 diabetes  uncircumcised male  an unusual or allergic reaction to empagliflozin, other medicines, foods, dyes, or preservatives  pregnant or trying to get pregnant  breast-feeding How should I use this medicine? Take this medicine by mouth with a glass of water. Follow the directions on the prescription label. Take it in the morning, with or without food. Take your dose at the same time each day. Do not take more often than directed. Do not stop taking except on your doctor's advice. Talk to your pediatrician regarding the use of this medicine in children. Special care may be needed. Overdosage: If you think you have taken too much of this medicine contact a poison control center or emergency room at once. NOTE: This medicine is only for you. Do not share this medicine with others. What if I miss a dose? If you miss a  dose, take it as soon as you can. If it is almost time for your next dose, take only that dose. Do not take double or extra doses. What may interact with this medicine? Do not take this medicine with any of the following medications:  gatifloxacin This medicine may also interact with the following medications:  alcohol  certain medicines for blood pressure, heart disease  diuretics This list may not describe all possible interactions. Give your health care provider a list of all the medicines, herbs, non-prescription drugs, or dietary supplements you use. Also tell them if you smoke, drink alcohol, or use illegal drugs. Some items may interact with your medicine. What should I watch for while using this medicine? Visit your doctor or health care professional for regular checks on your progress. This medicine can cause a serious condition in which there is too much acid in the blood. If you develop nausea, vomiting, stomach pain, unusual tiredness, or breathing problems, stop taking this medicine and call your doctor right away. If possible, use a ketone dipstick to check for ketones in your urine. A test called the HbA1C (A1C) will be monitored. This is a simple blood test. It measures your blood sugar control over the last 2 to 3 months. You will receive this test every 3 to 6 months. Learn how to check your blood sugar. Learn the symptoms of low and high blood sugar and how to manage them. Always carry a quick-source of sugar with you in case you have symptoms of low   blood sugar. Examples include hard sugar candy or glucose tablets. Make sure others know that you can choke if you eat or drink when you develop serious symptoms of low blood sugar, such as seizures or unconsciousness. They must get medical help at once. Tell your doctor or health care professional if you have high blood sugar. You might need to change the dose of your medicine. If you are sick or exercising more than usual, you  might need to change the dose of your medicine. Do not skip meals. Ask your doctor or health care professional if you should avoid alcohol. Many nonprescription cough and cold products contain sugar or alcohol. These can affect blood sugar. Wear a medical ID bracelet or chain, and carry a card that describes your disease and details of your medicine and dosage times. What side effects may I notice from receiving this medicine? Side effects that you should report to your doctor or health care professional as soon as possible:  allergic reactions like skin rash, itching or hives, swelling of the face, lips, or tongue  breathing problems  dizziness  feeling faint or lightheaded, falls  muscle weakness  nausea, vomiting, unusual stomach upset or pain  penile discharge, itching, or pain in men  signs and symptoms of a genital infection, such as fever; tenderness, redness, or swelling in the genitals or area from the genitals to the back of the rectum  signs and symptoms of low blood sugar such as feeling anxious, confusion, dizziness, increased hunger, unusually weak or tired, sweating, shakiness, cold, irritable, headache, blurred vision, fast heartbeat, loss of consciousness  signs and symptoms of a urinary tract infection, such as fever, chills, a burning feeling when urinating, blood in the urine, back pain  trouble passing urine or change in the amount of urine, including an urgent need to urinate more often, in larger amounts, or at night  unusual tiredness  vaginal discharge, itching, or odor in women Side effects that usually do not require medical attention (report to your doctor or health care professional if they continue or are bothersome):  mild increase in urination  thirsty This list may not describe all possible side effects. Call your doctor for medical advice about side effects. You may report side effects to FDA at 1-800-FDA-1088. Where should I keep my  medicine? Keep out of the reach of children. Store at room temperature between 20 and 25 degrees C (68 and 77 degrees F). Throw away any unused medicine after the expiration date. NOTE: This sheet is a summary. It may not cover all possible information. If you have questions about this medicine, talk to your doctor, pharmacist, or health care provider.  2020 Elsevier/Gold Standard (2016-11-30 10:25:34)  

## 2019-04-17 ENCOUNTER — Encounter: Payer: Self-pay | Admitting: Family Medicine

## 2019-04-17 DIAGNOSIS — M25521 Pain in right elbow: Secondary | ICD-10-CM

## 2019-05-31 ENCOUNTER — Other Ambulatory Visit: Payer: Self-pay | Admitting: Family Medicine

## 2019-07-22 ENCOUNTER — Other Ambulatory Visit: Payer: Self-pay

## 2019-07-22 ENCOUNTER — Encounter: Payer: Self-pay | Admitting: Family Medicine

## 2019-07-22 ENCOUNTER — Ambulatory Visit: Payer: BC Managed Care – PPO | Admitting: Family Medicine

## 2019-07-22 DIAGNOSIS — I10 Essential (primary) hypertension: Secondary | ICD-10-CM

## 2019-07-22 DIAGNOSIS — L720 Epidermal cyst: Secondary | ICD-10-CM

## 2019-07-22 DIAGNOSIS — B356 Tinea cruris: Secondary | ICD-10-CM

## 2019-07-22 DIAGNOSIS — B351 Tinea unguium: Secondary | ICD-10-CM

## 2019-07-22 DIAGNOSIS — E119 Type 2 diabetes mellitus without complications: Secondary | ICD-10-CM | POA: Diagnosis not present

## 2019-07-22 DIAGNOSIS — F419 Anxiety disorder, unspecified: Secondary | ICD-10-CM

## 2019-07-22 DIAGNOSIS — F329 Major depressive disorder, single episode, unspecified: Secondary | ICD-10-CM

## 2019-07-22 DIAGNOSIS — E782 Mixed hyperlipidemia: Secondary | ICD-10-CM | POA: Diagnosis not present

## 2019-07-22 LAB — COMPREHENSIVE METABOLIC PANEL
ALT: 20 U/L (ref 0–53)
AST: 20 U/L (ref 0–37)
Albumin: 4.5 g/dL (ref 3.5–5.2)
Alkaline Phosphatase: 66 U/L (ref 39–117)
BUN: 12 mg/dL (ref 6–23)
CO2: 32 mEq/L (ref 19–32)
Calcium: 9.5 mg/dL (ref 8.4–10.5)
Chloride: 99 mEq/L (ref 96–112)
Creatinine, Ser: 1.01 mg/dL (ref 0.40–1.50)
GFR: 78.15 mL/min (ref >60.00)
Glucose, Bld: 139 mg/dL — ABNORMAL HIGH (ref 70–99)
Potassium: 4.2 mEq/L (ref 3.5–5.1)
Sodium: 136 mEq/L (ref 135–145)
Total Bilirubin: 0.7 mg/dL (ref 0.2–1.2)
Total Protein: 7 g/dL (ref 6.0–8.3)

## 2019-07-22 LAB — LIPID PANEL
Cholesterol: 86 mg/dL (ref 0–200)
HDL: 34.1 mg/dL — ABNORMAL LOW (ref >39.00)
LDL Cholesterol: 29 mg/dL (ref 0–99)
NonHDL: 51.96
Total CHOL/HDL Ratio: 3
Triglycerides: 114 mg/dL (ref 0.0–149.0)
VLDL: 22.8 mg/dL (ref 0.0–40.0)

## 2019-07-22 LAB — HEMOGLOBIN A1C: Hgb A1c MFr Bld: 7.2 % — ABNORMAL HIGH (ref 4.6–6.5)

## 2019-07-22 MED ORDER — EFINACONAZOLE 10 % EX SOLN: CUTANEOUS | 2 refills | Status: DC

## 2019-07-22 MED ORDER — KETOCONAZOLE 2 % EX CREA: 1.0000 "application " | TOPICAL_CREAM | Freq: Every day | CUTANEOUS | 0 refills | Status: DC

## 2019-07-22 NOTE — Assessment & Plan Note (Signed)
Check lipid panel  

## 2019-07-22 NOTE — Assessment & Plan Note (Signed)
Stable.  He will continue Wellbutrin.

## 2019-07-22 NOTE — Patient Instructions (Addendum)
Nice to see you. We will get labs today and contact you with the results. Please let me know if you need a referral to dermatology. Please let me know when you are ready to do your colonoscopy and PSA check. Please try the topical treatment for your toenails.  This may take quite some time to be beneficial.

## 2019-07-22 NOTE — Assessment & Plan Note (Signed)
Continue as needed use of ketoconazole.  No current symptoms.

## 2019-07-22 NOTE — Progress Notes (Signed)
Tommi Rumps, MD Phone: [phone]  Bradley Stevens is a 49 y.o. male who presents today for f/u.  HYPERTENSION  Disease Monitoring  Home BP Monitoring not checking Chest pain- no    Dyspnea- no Medications  Compliance-  Taking amlodipine, losartan.  Edema- no  DIABETES Disease Monitoring: Blood Sugar ranges-not checking Polyuria/phagia/dipsia- some polydipsia      Optho- due Medications: Compliance- taking metformin Hypoglycemic symptoms- no  Depression/anxiety: Patient notes these things are stable.  He notes it is nothing any more significant than dealing with normal life.  No SI.  He thinks Wellbutrin has been helpful.  Obesity: Patient has try to get outside as much as he can.  His diet has not really changed though he is trying to make some better choices with fish and chicken and salads as opposed to red meat.  Jock itch: Patient notes occasionally he will get this and he will use the ketoconazole cream on it and this will resolve.  No current issues with this.     Social History   Tobacco Use  Smoking Status Never Smoker  Smokeless Tobacco Current User  . Types: Snuff     ROS see history of present illness  Objective  Physical Exam Vitals:   07/22/19 0832  BP: 120/80  Pulse: 78  Temp: (!) 97.4 F (36.3 C)  SpO2: 98%    BP Readings from Last 3 Encounters:  07/22/19 120/80  12/11/18 110/70  06/12/18 118/84   Wt Readings from Last 3 Encounters:  07/22/19 279 lb 12.8 oz (126.9 kg)  03/18/19 270 lb (122.5 kg)  12/11/18 280 lb 6.4 oz (127.2 kg)    Physical Exam Constitutional:      General: He is not in acute distress.    Appearance: He is not diaphoretic.  Cardiovascular:     Rate and Rhythm: Normal rate and regular rhythm.     Heart sounds: Normal heart sounds.  Pulmonary:     Effort: Pulmonary effort is normal.     Breath sounds: Normal breath sounds.  Musculoskeletal:     Right lower leg: No edema.     Left lower leg: No edema.    Skin:    General: Skin is warm and dry.  Neurological:     Mental Status: He is alert.    Diabetic Foot Exam - Simple   Simple Foot Form Diabetic Foot exam was performed with the following findings: Yes 07/22/2019  8:57 AM  Visual Inspection See comments: Yes Sensation Testing Intact to touch and monofilament testing bilaterally: Yes Pulse Check Posterior Tibialis and Dorsalis pulse intact bilaterally: Yes Comments Onychomycosis bilateral great toes, otherwise no deformities, ulcerations, or skin breakdown bilateral       Assessment/Plan: Please see individual problem list.  Essential hypertension At goal.  Continue current regimen.  Check CMP.  DM (diabetes mellitus), type 2 (HCC) Check A1c.  Continue Metformin.  I encouraged him to see the ophthalmologist.  Jock itch Continue as needed use of ketoconazole.  No current symptoms.  Epidermal cyst He has seen dermatology for these.  He is considering having them removed.  He will let us know if he needs a referral back to dermatology.  Mixed hyperlipidemia Check lipid panel.  Anxiety and depression Stable.  He will continue Wellbutrin.  Onychomycosis Trial of efinaconazole. Discussed that topical treatments are generally less effective. Discussed lamisil and risk of liver injury. Patient opted for topical treatment.    Health Maintenance: Patient defers colonoscopy at this time.  He defers PSA check at this time as well. He is going to check with his insurance to determine coverage.  Orders Placed This Encounter  Procedures  . HgB A1c  . Comp Met (CMET)  . Lipid panel    Meds ordered this encounter  Medications  . ketoconazole (NIZORAL) 2 % cream    Sig: Apply 1 application topically daily.    Dispense:  15 g    Refill:  0  . Efinaconazole 10 % SOLN    Sig: Apply to affected toenail(s) once daily for 48 weeks.    Dispense:  8 mL    Refill:  2    This visit occurred during the SARS-CoV-2 public health  emergency.  Safety protocols were in place, including screening questions prior to the visit, additional usage of staff PPE, and extensive cleaning of exam room while observing appropriate contact time as indicated for disinfecting solutions.    Tommi Rumps, MD [place]

## 2019-07-22 NOTE — Assessment & Plan Note (Signed)
He has seen dermatology for these.  He is considering having them removed.  He will let us know if he needs a referral back to dermatology.

## 2019-07-22 NOTE — Assessment & Plan Note (Addendum)
Check A1c.  Continue Metformin.  I encouraged him to see the ophthalmologist.

## 2019-07-22 NOTE — Assessment & Plan Note (Signed)
At goal.  Continue current regimen.  Check CMP. 

## 2019-07-22 NOTE — Assessment & Plan Note (Signed)
Trial of efinaconazole. Discussed that topical treatments are generally less effective. Discussed lamisil and risk of liver injury. Patient opted for topical treatment.

## 2019-07-24 ENCOUNTER — Other Ambulatory Visit: Payer: Self-pay | Admitting: Family Medicine

## 2019-07-24 MED ORDER — EMPAGLIFLOZIN 10 MG PO TABS: 10.0000 mg | ORAL_TABLET | Freq: Every day | ORAL | 2 refills | Status: DC

## 2019-08-10 ENCOUNTER — Other Ambulatory Visit: Payer: Self-pay | Admitting: Family Medicine

## 2019-08-10 DIAGNOSIS — E782 Mixed hyperlipidemia: Secondary | ICD-10-CM

## 2019-09-05 ENCOUNTER — Other Ambulatory Visit: Payer: Self-pay | Admitting: Family Medicine

## 2019-10-25 ENCOUNTER — Other Ambulatory Visit: Payer: Self-pay | Admitting: Family Medicine

## 2019-10-29 ENCOUNTER — Other Ambulatory Visit: Payer: Self-pay | Admitting: Family Medicine

## 2019-12-05 ENCOUNTER — Encounter: Payer: Self-pay | Admitting: Family Medicine

## 2019-12-06 ENCOUNTER — Other Ambulatory Visit: Payer: Self-pay | Admitting: Family Medicine

## 2019-12-09 NOTE — Telephone Encounter (Signed)
The pt was advised that we did not have any appts available until should go to the UC across the street or in [place]. He stated that he did not want to do that and he would wait until Thursday. FYI

## 2019-12-09 NOTE — Telephone Encounter (Signed)
Needs an appt for evaluation

## 2019-12-10 NOTE — Telephone Encounter (Signed)
Noted. Based on his description it seem reasonable to be evaluated on Thursday.

## 2019-12-11 ENCOUNTER — Ambulatory Visit: Payer: BC Managed Care – PPO | Admitting: Internal Medicine

## 2019-12-12 ENCOUNTER — Other Ambulatory Visit: Payer: Self-pay

## 2019-12-12 ENCOUNTER — Encounter: Payer: Self-pay | Admitting: Internal Medicine

## 2019-12-12 ENCOUNTER — Ambulatory Visit (INDEPENDENT_AMBULATORY_CARE_PROVIDER_SITE_OTHER): Payer: BC Managed Care – PPO | Admitting: Internal Medicine

## 2019-12-12 VITALS — BP 116/80 | HR 71 | Temp 98.2°F | Ht 74.0 in | Wt 275.8 lb

## 2019-12-12 DIAGNOSIS — I152 Hypertension secondary to endocrine disorders: Secondary | ICD-10-CM | POA: Insufficient documentation

## 2019-12-12 DIAGNOSIS — R319 Hematuria, unspecified: Secondary | ICD-10-CM | POA: Diagnosis not present

## 2019-12-12 DIAGNOSIS — I10 Essential (primary) hypertension: Secondary | ICD-10-CM

## 2019-12-12 DIAGNOSIS — E1159 Type 2 diabetes mellitus with other circulatory complications: Secondary | ICD-10-CM | POA: Diagnosis not present

## 2019-12-12 DIAGNOSIS — B356 Tinea cruris: Secondary | ICD-10-CM

## 2019-12-12 MED ORDER — KETOCONAZOLE 2 % EX CREA: 1.0000 "application " | TOPICAL_CREAM | Freq: Every day | CUTANEOUS | 11 refills | Status: DC

## 2019-12-12 NOTE — Patient Instructions (Signed)
[facility] in [place] [facility] [place] [facility] in [place]   Outpatient colonoscopies    Hematuria, Adult Hematuria is blood in the urine. Blood may be visible in the urine, or it may be identified with a test. This condition can be caused by infections of the bladder, urethra, kidney, or prostate. Other possible causes include:  Kidney stones.  Cancer of the urinary tract.  Too much calcium in the urine.  Conditions that are passed from parent to child (inherited conditions).  Exercise that requires a lot of energy. Infections can usually be treated with medicine, and a kidney stone usually will pass through your urine. If neither of these is the cause of your hematuria, more tests may be needed to identify the cause of your symptoms. It is very important to tell your health care provider about any blood in your urine, even if it is painless or the blood stops without treatment. Blood in the urine, when it happens and then stops and then happens again, can be a symptom of a very serious condition, including cancer. There is no pain in the initial stages of many urinary cancers. Follow these instructions at home: Medicines  Take over-the-counter and prescription medicines only as told by your health care provider.  If you were prescribed an antibiotic medicine, take it as told by your health care provider. Do not stop taking the antibiotic even if you start to feel better. Eating and drinking  Drink enough fluid to keep your urine clear or pale yellow. It is recommended that you drink 3-4 quarts (2.8-3.8 L) a day. If you have been diagnosed with an infection, it is recommended that you drink cranberry juice in addition to large amounts of water.  Avoid caffeine, tea, and carbonated beverages. These tend to irritate the bladder.  Avoid alcohol because it may irritate the prostate (men). General instructions  If you have been diagnosed with a kidney stone, follow your health care  provider's instructions about straining your urine to catch the stone.  Empty your bladder often. Avoid holding urine for long periods of time.  If you are male: ? After a bowel movement, wipe from front to back and use each piece of toilet paper only once. ? Empty your bladder before and after sex.  Pay attention to any changes in your symptoms. Tell your health care provider about any changes or any new symptoms.  It is your responsibility to get your test results. Ask your health care provider, or the department performing the test, when your results will be ready.  Keep all follow-up visits as told by your health care provider. This is important. Contact a health care provider if:  You develop back pain.  You have a fever.  You have nausea or vomiting.  Your symptoms do not improve after 3 days.  Your symptoms get worse. Get help right away if:  You develop severe vomiting and are unable take medicine without vomiting.  You develop severe pain in your back or abdomen even though you are taking medicine.  You pass a large amount of blood in your urine.  You pass blood clots in your urine.  You feel very weak or like you might faint.  You faint. Summary  Hematuria is blood in the urine. It has many possible causes.  It is very important that you tell your health care provider about any blood in your urine, even if it is painless or the blood stops without treatment.  Take  over-the-counter and prescription medicines only as told by your health care provider.  Drink enough fluid to keep your urine clear or pale yellow. This information is not intended to replace advice given to you by your health care provider. Make sure you discuss any questions you have with your health care provider. Document Revised: 08/14/2018 Document Reviewed: 04/22/2016 Elsevier Patient Education  2020 [employer].

## 2019-12-12 NOTE — Progress Notes (Signed)
Chief Complaint  Patient presents with  . Hematuria   Bright red Blood in urine clots appearance 2 weeks ago and blood thurs/friday this week. He did have burning at end of stream but resolved and had LLB pain but resolved for now and was mild and sharp. Denies fever/ab pain. 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   Cardiovascular: Negative for chest pain.  Gastrointestinal: Negative for abdominal pain.  Genitourinary: Positive for hematuria.  Musculoskeletal: Negative for back pain.  Skin: Negative for itching and rash.   Past Medical History:  Diagnosis Date  . UTI (urinary tract infection)    Past Surgical History:  Procedure Laterality Date  . NO PAST SURGERIES     Family History  Problem Relation Age of Onset  . Arthritis Mother   . Breast cancer Mother   . Hyperlipidemia Mother   . Heart disease Mother   . Hypertension Mother   . Mental illness Mother   . Lung cancer Father   . Arthritis Maternal Grandmother   . Stroke Maternal Grandmother   . Mental illness Maternal Grandmother   . Alcohol abuse Maternal Grandfather   . Arthritis Maternal Grandfather   . Hyperlipidemia Maternal Grandfather   . Heart disease Maternal Grandfather   . Hypertension Maternal Grandfather   . Diabetes Maternal Grandfather   . Arthritis Paternal Grandmother   . Diabetes Paternal Grandmother   . Arthritis Paternal Grandfather   . Lung cancer Paternal Grandfather    Social History   Socioeconomic History  . Marital status: Married    Spouse name: Not on file  . Number of children: Not on file  . Years of education: Not on file  . Highest education level: Not on file  Occupational History  . Not on file  Tobacco Use  . Smoking status: Never Smoker  . Smokeless tobacco: Current User    Types: Snuff   Substance and Sexual Activity  . Alcohol use: No  . Drug use: No  . Sexual activity: Yes    Partners: Female  Other Topics Concern  . Not on file  Social History Narrative  . Not on file   Social Determinants of Health   Financial Resource Strain:   . Difficulty of Paying Living Expenses: Not on file  Food Insecurity:   . Worried About Programme researcher, broadcasting/film/video in the Last Year: Not on file  . Ran Out of Food in the Last Year: Not on file  Transportation Needs:   . Lack of Transportation (Medical): Not on file  . Lack of Transportation (Non-Medical): Not on file  Physical Activity:   . Days of Exercise per Week: Not on file  . Minutes of Exercise per Session: Not on file  Stress:   . Feeling of Stress : Not on file  Social Connections:   . Frequency of Communication with Friends and Family: Not on file  . Frequency of Social Gatherings with Friends and Family: Not on file  . Attends Religious Services: Not on file  . Active Member of Clubs or Organizations: Not on file  . Attends Banker Meetings: Not on file  . Marital Status: Not on file  Intimate Partner Violence:   . Fear of Current or Ex-Partner: Not on file  . Emotionally Abused: Not  on file  . Physically Abused: Not on file  . Sexually Abused: Not on file   Current Meds  Medication Sig  . amLODipine (NORVASC) 5 MG tablet TAKE 1 TABLET BY MOUTH EVERY DAY  . aspirin EC 81 MG tablet Take 81 mg by mouth daily.  Marland Kitchen buPROPion (WELLBUTRIN XL) 150 MG 24 hr tablet TAKE 1 TABLET BY MOUTH EVERY DAY  . cyclobenzaprine (FLEXERIL) 10 MG tablet Take 1 tablet (10 mg total) by mouth 3 (three) times daily as needed for muscle spasms.  . Efinaconazole 10 % SOLN Apply to affected toenail(s) once daily for 48 weeks.  Marland Kitchen JARDIANCE 10 MG TABS tablet TAKE 1 TABLET BY MOUTH EVERY DAY BEFORE BREAKFAST  . ketoconazole (NIZORAL) 2 % cream Apply 1 application topically daily.  Marland Kitchen losartan (COZAAR) 100 MG tablet TAKE 1 TABLET BY MOUTH  EVERY DAY  . metFORMIN (GLUCOPHAGE) 1000 MG tablet TAKE 1 TABLET (1,000 MG TOTAL) BY MOUTH 2 (TWO) TIMES DAILY WITH A MEAL.  . rosuvastatin (CRESTOR) 20 MG tablet TAKE 1 TABLET BY MOUTH EVERY DAY  . [DISCONTINUED] ketoconazole (NIZORAL) 2 % cream Apply 1 application topically daily.   No Known Allergies No results found for this or any previous visit (from the past 2160 hour(s)). Objective  Body mass index is 35.41 kg/m. Wt Readings from Last 3 Encounters:  12/12/19 275 lb 12.8 oz (125.1 kg)  07/22/19 279 lb 12.8 oz (126.9 kg)  03/18/19 270 lb (122.5 kg)   Temp Readings from Last 3 Encounters:  12/12/19 98.2 F (36.8 C) (Oral)  07/22/19 (!) 97.4 F (36.3 C) (Temporal)  12/11/18 98.4 F (36.9 C) (Temporal)   BP Readings from Last 3 Encounters:  12/12/19 116/80  07/22/19 120/80  12/11/18 110/70   Pulse Readings from Last 3 Encounters:  12/12/19 71  07/22/19 78  12/11/18 73    Physical Exam Vitals and nursing note reviewed.  Constitutional:      Appearance: Normal appearance. He is well-developed and well-groomed. He is obese.  HENT:     Head: Normocephalic and atraumatic.  Cardiovascular:     Rate and Rhythm: Normal rate and regular rhythm.     Heart sounds: Normal heart sounds. No murmur heard.   Pulmonary:     Effort: Pulmonary effort is normal.     Breath sounds: Normal breath sounds.  Abdominal:     General: Abdomen is flat. Bowel sounds are normal.     Tenderness: There is no abdominal tenderness. There is no right CVA tenderness or left CVA tenderness.  Skin:    General: Skin is warm and moist.  Neurological:     General: No focal deficit present.     Mental Status: He is alert and oriented to person, place, and time. Mental status is at baseline.     Gait: Gait normal.  Psychiatric:        Attention and Perception: Attention and perception normal.        Mood and Affect: Mood and affect normal.        Speech: Speech normal.        Behavior: Behavior  normal. Behavior is cooperative.        Thought Content: Thought content normal.        Cognition and Memory: Cognition and memory normal.        Judgment: Judgment normal.     Assessment  Plan  Hematuria, unspecified type - Plan: Urinalysis, Routine w reflex microscopic, Urine Culture  Consider CT renal  w/u kidney stones  Consider urology in future if continues  Hold Abx until culture per pt   Jock itch - Plan: ketoconazole (NIZORAL) 2 % cream  Hypertension associated with diabetes (HCC) - Plan: Microalbumin / creatinine urine ratio  Will need fasting labs upcoming appt 01/2020 with PCP for DOT physical and copy of labs   Provider: Dr. French Ana McLean-Scocuzza-[facility] Medicine

## 2019-12-13 LAB — URINE CULTURE
MICRO NUMBER:: 10935551
SPECIMEN QUALITY:: ADEQUATE

## 2019-12-13 LAB — URINALYSIS, ROUTINE W REFLEX MICROSCOPIC
Bilirubin Urine: NEGATIVE
Hgb urine dipstick: NEGATIVE
Ketones, ur: NEGATIVE
Leukocytes,Ua: NEGATIVE
Nitrite: NEGATIVE
Protein, ur: NEGATIVE
Specific Gravity, Urine: 1.039 — ABNORMAL HIGH (ref 1.001–1.03)
pH: 6.5 (ref 5.0–8.0)

## 2019-12-13 LAB — MICROALBUMIN / CREATININE URINE RATIO
Creatinine, Urine: 107 mg/dL (ref 20–320)
Microalb Creat Ratio: 6 mcg/mg creat (ref <30)
Microalb, Ur: 0.6 mg/dL

## 2019-12-17 ENCOUNTER — Telehealth: Payer: Self-pay | Admitting: Internal Medicine

## 2019-12-17 NOTE — Telephone Encounter (Signed)
Tilford Pillar, CMA  12/17/2019 2:46 PM EDT Back to Top    Patient states he will think about this and call us back tomorrow.    Pasty Spillers McLean-Scocuzza, MD  12/16/2019 1:55 PM EDT     Even though no current blood does he want imaging I.e CT scan to look at bladder and kidneys?   Tilford Pillar, CMA  12/16/2019 1:32 PM EDT     Patient informed and verbalized understanding.   Patient is not seeing blood. States he only saw it the 2 times before his appointment    Pasty Spillers McLean-Scocuzza, MD  12/14/2019 5:41 PM EDT     Urine negative protein Sugar in urine due to jardiance  Few bacteria 10K colonies and 100K is uti  -if still seeing blood we can recollect urine culture  -Lorella Gomez ease order if so as culture suggests this ?  If still seeing blood does he want to order CT bladder system or have urology consult or both?

## 2019-12-29 ENCOUNTER — Telehealth: Payer: Self-pay | Admitting: Family Medicine

## 2019-12-29 NOTE — Telephone Encounter (Signed)
lft vm for pt to call ofc to sch CT. 

## 2019-12-29 NOTE — Addendum Note (Signed)
Addended by: Quentin Ore on: 12/29/2019 12:34 PM   Modules accepted: Orders

## 2019-12-30 ENCOUNTER — Telehealth: Payer: Self-pay | Admitting: Family Medicine

## 2019-12-30 NOTE — Telephone Encounter (Signed)
lft vm for pt to call ofc to sch CT. 

## 2020-01-13 ENCOUNTER — Other Ambulatory Visit: Payer: Self-pay

## 2020-01-13 ENCOUNTER — Ambulatory Visit
Admission: RE | Admit: 2020-01-13 | Discharge: 2020-01-13 | Disposition: A | Discharge status: Home / Self Care | Payer: BC Managed Care – PPO | Source: Ambulatory Visit | Attending: Internal Medicine | Admitting: Internal Medicine

## 2020-01-13 DIAGNOSIS — R319 Hematuria, unspecified: Secondary | ICD-10-CM | POA: Diagnosis not present

## 2020-01-21 ENCOUNTER — Other Ambulatory Visit: Payer: Self-pay

## 2020-01-21 ENCOUNTER — Ambulatory Visit (INDEPENDENT_AMBULATORY_CARE_PROVIDER_SITE_OTHER): Payer: BC Managed Care – PPO | Admitting: Family Medicine

## 2020-01-21 ENCOUNTER — Encounter: Payer: Self-pay | Admitting: Family Medicine

## 2020-01-21 VITALS — Ht 74.0 in | Wt 280.0 lb

## 2020-01-21 DIAGNOSIS — R319 Hematuria, unspecified: Secondary | ICD-10-CM | POA: Insufficient documentation

## 2020-01-21 DIAGNOSIS — I10 Essential (primary) hypertension: Secondary | ICD-10-CM

## 2020-01-21 DIAGNOSIS — E119 Type 2 diabetes mellitus without complications: Secondary | ICD-10-CM | POA: Diagnosis not present

## 2020-01-21 DIAGNOSIS — J302 Other seasonal allergic rhinitis: Secondary | ICD-10-CM | POA: Diagnosis not present

## 2020-01-21 DIAGNOSIS — E1159 Type 2 diabetes mellitus with other circulatory complications: Secondary | ICD-10-CM

## 2020-01-21 DIAGNOSIS — J309 Allergic rhinitis, unspecified: Secondary | ICD-10-CM | POA: Insufficient documentation

## 2020-01-21 DIAGNOSIS — R310 Gross hematuria: Secondary | ICD-10-CM

## 2020-01-21 DIAGNOSIS — Z1211 Encounter for screening for malignant neoplasm of colon: Secondary | ICD-10-CM

## 2020-01-21 DIAGNOSIS — Z125 Encounter for screening for malignant neoplasm of prostate: Secondary | ICD-10-CM | POA: Insufficient documentation

## 2020-01-21 DIAGNOSIS — F17220 Nicotine dependence, chewing tobacco, uncomplicated: Secondary | ICD-10-CM

## 2020-01-21 DIAGNOSIS — I152 Hypertension secondary to endocrine disorders: Secondary | ICD-10-CM

## 2020-01-21 MED ORDER — BUPROPION HCL ER (XL) 150 MG PO TB24
300.0000 mg | ORAL_TABLET | Freq: Every day | ORAL | 1 refills | Status: DC
Start: 2020-01-21 — End: 2020-09-01

## 2020-01-21 NOTE — Assessment & Plan Note (Signed)
See above for plan.  

## 2020-01-21 NOTE — Assessment & Plan Note (Signed)
Referral placed for colonoscopy.  The patient will check with his insurance to see who is in network and he will let us know if [facility] is not in network.

## 2020-01-21 NOTE — Assessment & Plan Note (Signed)
Tobacco cessation counseling was provided.  Approximately 4 minutes were spent discussing the rationale for tobacco cessation and strategies for doing so.  Adjuncts, including nicotine patches, nicotine lozenges, varenicline and buproprion were recommended.  We will increase his dose of bupropion to see if that provides additional benefit.  We will follow up again on this in 6 months.

## 2020-01-21 NOTE — Progress Notes (Signed)
Virtual Visit via telephone Note  This visit type was conducted due to national recommendations for restrictions regarding the COVID-19 pandemic (e.g. social distancing).  This format is felt to be most appropriate for this patient at this time.  All issues noted in this document were discussed and addressed.  No physical exam was performed (except for noted visual exam findings with Video Visits).   I connected with Bradley Stevens today at  8:30 AM EDT by telephone and verified that I am speaking with the correct person using two identifiers. Location patient: work Location provider: work Persons participating in the virtual visit: patient, provider  I discussed the limitations, risks, security and privacy concerns of performing an evaluation and management service by telephone and the availability of in person appointments. I also discussed with the patient that there may be a patient responsible charge related to this service. The patient expressed understanding and agreed to proceed.  Interactive audio and video telecommunications were attempted between this provider and patient, however failed, due to patient having technical difficulties OR patient did not have access to video capability.  We continued and completed visit with audio only.   Reason for visit: f/u.  HPI: HYPERTENSION  Disease Monitoring  Home BP Monitoring not checking Chest pain- no    Dyspnea- no Medications  Compliance-  Taking losartan, amlodipine.   Edema- no  DIABETES Disease Monitoring: Blood Sugar ranges-not checking Polyuria/phagia/dipsia- no      Medications: Compliance- taking jardiance, metformin Hypoglycemic symptoms- no  Allergic rhinitis: Patient notes symptoms typically 1-2 times a year with symptoms typically occurring this time of year.  Has had some postnasal drip with mild sore throat that occurs only in the morning and clears up by the afternoon.  No sneezing.  No fevers.  No cough or  congestion.  No chest congestion.  No COVID-19 exposure.  He is fully vaccinated against COVID-19.  Has been taking NyQuil and DayQuil.  No allergy medicines.  Nicotine dependence, chewing tobacco: Patient has cut back to half a can per day.  He notes this will get easier once he retires from work at the start of next year.  He is currently on Wellbutrin though is unsure if that has made much of a difference.  He does not appear to be on maximum dosing of this.  Hematuria: Patient has not noted any additional episodes of hematuria though did have gross bright red blood when urinating previously.  This occurred on several occasions.  He had no associated pain.  No history of kidney stones.  CT imaging did not reveal a cause for this.    ROS: See pertinent positives and negatives per HPI.  Past Medical History:  Diagnosis Date  . UTI (urinary tract infection)     Past Surgical History:  Procedure Laterality Date  . NO PAST SURGERIES      Family History  Problem Relation Age of Onset  . Arthritis Mother   . Breast cancer Mother   . Hyperlipidemia Mother   . Heart disease Mother   . Hypertension Mother   . Mental illness Mother   . Lung cancer Father   . Arthritis Maternal Grandmother   . Stroke Maternal Grandmother   . Mental illness Maternal Grandmother   . Alcohol abuse Maternal Grandfather   . Arthritis Maternal Grandfather   . Hyperlipidemia Maternal Grandfather   . Heart disease Maternal Grandfather   . Hypertension Maternal Grandfather   . Diabetes Maternal Grandfather   .  Arthritis Paternal Grandmother   . Diabetes Paternal Grandmother   . Arthritis Paternal Grandfather   . Lung cancer Paternal Grandfather     SOCIAL HX: Chewing tobacco use   Current Outpatient Medications:  .  amLODipine (NORVASC) 5 MG tablet, TAKE 1 TABLET BY MOUTH EVERY DAY, Disp: 90 tablet, Rfl: 3 .  aspirin EC 81 MG tablet, Take 81 mg by mouth daily., Disp: , Rfl:  .  buPROPion (WELLBUTRIN  XL) 150 MG 24 hr tablet, Take 2 tablets (300 mg total) by mouth daily., Disp: 180 tablet, Rfl: 1 .  cyclobenzaprine (FLEXERIL) 10 MG tablet, Take 1 tablet (10 mg total) by mouth 3 (three) times daily as needed for muscle spasms., Disp: 10 tablet, Rfl: 0 .  Efinaconazole 10 % SOLN, Apply to affected toenail(s) once daily for 48 weeks., Disp: 8 mL, Rfl: 2 .  JARDIANCE 10 MG TABS tablet, TAKE 1 TABLET BY MOUTH EVERY DAY BEFORE BREAKFAST, Disp: 30 tablet, Rfl: 2 .  ketoconazole (NIZORAL) 2 % cream, Apply 1 application topically daily., Disp: 30 g, Rfl: 11 .  losartan (COZAAR) 100 MG tablet, TAKE 1 TABLET BY MOUTH EVERY DAY, Disp: 90 tablet, Rfl: 3 .  metFORMIN (GLUCOPHAGE) 1000 MG tablet, TAKE 1 TABLET (1,000 MG TOTAL) BY MOUTH 2 (TWO) TIMES DAILY WITH A MEAL., Disp: 180 tablet, Rfl: 1 .  rosuvastatin (CRESTOR) 20 MG tablet, TAKE 1 TABLET BY MOUTH EVERY DAY, Disp: 90 tablet, Rfl: 3  EXAM: This was a telephone visit and thus no physical exam was completed.  ASSESSMENT AND PLAN:  Discussed the following assessment and plan:  Problem List Items Addressed This Visit    Allergic rhinitis    Symptoms likely related to allergic rhinitis.  Discussed adding in Flonase.  He will monitor and if not improving he will let us know.      Colon cancer screening    Referral placed for colonoscopy.  The patient will check with his insurance to see who is in network and he will let us know if [facility] is not in network.      Relevant Orders   Ambulatory referral to Gastroenterology   DM (diabetes mellitus), type 2 (HCC)    Check A1c.  He will continue Jardiance 10 mg once daily and Metformin 1000 mg twice daily.      Relevant Orders   HgB A1c   Essential hypertension - Primary    Undetermined control.  He will have a nurse BP check when he comes in for labs in 2 weeks.  Continue amlodipine 5 mg once daily and losartan 100 mg once daily.  Labs as outlined below.      Relevant Orders   Basic  Metabolic Panel (BMET)   Hematuria    Advised that he see urology given painless gross hematuria with no cause found on CT imaging.  Referral placed.  The patient was concerned about the specialist being in network.  I advised he needs to check with his insurance to see who is in network in our area.  He will let us know if [facility] are not in network.  Discussed the risk of missing a significant bladder lesion without cystoscopy through urology.      Relevant Orders   Ambulatory referral to Urology   Hypertension associated with diabetes [facility])    See above for plan.      Nicotine dependence, chewing tobacco, uncomplicated    Tobacco cessation counseling was provided.  Approximately 4 minutes  were spent discussing the rationale for tobacco cessation and strategies for doing so.  Adjuncts, including nicotine patches, nicotine lozenges, varenicline and buproprion were recommended.  We will increase his dose of bupropion to see if that provides additional benefit.  We will follow up again on this in 6 months.           I discussed the assessment and treatment plan with the patient. The patient was provided an opportunity to ask questions and all were answered. The patient agreed with the plan and demonstrated an understanding of the instructions.   The patient was advised to call back or seek an in-person evaluation if the symptoms worsen or if the condition fails to improve as anticipated.  I provided 19 minutes of non-face-to-face time during this encounter.   [name], MD

## 2020-01-21 NOTE — Assessment & Plan Note (Signed)
Undetermined control.  He will have a nurse BP check when he comes in for labs in 2 weeks.  Continue amlodipine 5 mg once daily and losartan 100 mg once daily.  Labs as outlined below.

## 2020-01-21 NOTE — Assessment & Plan Note (Signed)
Symptoms likely related to allergic rhinitis.  Discussed adding in Flonase.  He will monitor and if not improving he will let us know.

## 2020-01-21 NOTE — Assessment & Plan Note (Signed)
Check A1c.  He will continue Jardiance 10 mg once daily and Metformin 1000 mg twice daily.

## 2020-01-21 NOTE — Assessment & Plan Note (Addendum)
Advised that he see urology given painless gross hematuria with no cause found on CT imaging.  Referral placed.  The patient was concerned about the specialist being in network.  I advised he needs to check with his insurance to see who is in network in our area.  He will let us know if [facility] are not in network.  Discussed the risk of missing a significant bladder lesion without cystoscopy through urology.

## 2020-01-26 ENCOUNTER — Telehealth: Payer: Self-pay | Admitting: Family Medicine

## 2020-01-26 NOTE — Telephone Encounter (Signed)
Rejection Reason - Patient Declined - CALLED AND BOTH TIMES PT DECLINED TO SCHED SAID HE WOULD CB WHEN HE WAS READY TO SCHD" [facility] said on Jan 26, 2020 10:20 AM  "CALLED PT TO SCHEDULE HE DECLINED STATING HE WANTED TO SPEAK WITH HIS INSURANCE FIRST" [facility] said on Jan 22, 2020 10:37 AM

## 2020-01-27 NOTE — Telephone Encounter (Signed)
Noted. Please reach out to the patient in a few days to see if he has been able to contact his insurance to see if [facility] is covered or if he needs to be referred to a different urologist. Thanks.

## 2020-02-03 ENCOUNTER — Other Ambulatory Visit: Payer: Self-pay | Admitting: Family Medicine

## 2020-02-10 ENCOUNTER — Other Ambulatory Visit: Payer: Self-pay | Admitting: Family Medicine

## 2020-02-16 NOTE — Telephone Encounter (Signed)
I called the patient and the mailbox is full could not leave a message.  Messi Twedt,cma

## 2020-03-18 ENCOUNTER — Telehealth: Payer: Self-pay

## 2020-03-18 NOTE — Telephone Encounter (Signed)
Form from [employer] came in about the patient's DM.  and I filled it out and faxed to the company confirmation given.  Kyira Volkert,cma

## 2020-04-24 ENCOUNTER — Other Ambulatory Visit: Payer: Self-pay | Admitting: Family Medicine

## 2020-04-30 ENCOUNTER — Encounter: Payer: Self-pay | Admitting: *Deleted

## 2020-04-30 LAB — HEMOGLOBIN A1C: Hemoglobin A1C: 7

## 2020-05-04 LAB — HM DIABETES EYE EXAM

## 2020-05-15 ENCOUNTER — Other Ambulatory Visit: Payer: Self-pay | Admitting: Family Medicine

## 2020-05-17 LAB — HM DIABETES EYE EXAM

## 2020-05-18 ENCOUNTER — Telehealth: Payer: Self-pay | Admitting: Family Medicine

## 2020-05-18 NOTE — Telephone Encounter (Signed)
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  It reveals he has sleep apnea.  I would recommend that we refer him to pulmonology to get set up with a CPAP.  If he is willing to do that I can place a referral.  If he is not willing to do that we can try to get him set up with an auto titration CPAP.

## 2020-05-25 NOTE — Telephone Encounter (Signed)
I  called and LVM for the patient to return a call for a message from the provider.  Tekelia Kareem,cma

## 2020-05-26 NOTE — Telephone Encounter (Signed)
I called and spoke with the patient and he stated he saw pulmonary and had the sleep study and he picks up his C{PAP machine today.  Bradley Stevens,cma

## 2020-05-26 NOTE — Telephone Encounter (Signed)
Noted  

## 2020-07-16 ENCOUNTER — Other Ambulatory Visit: Payer: Self-pay | Admitting: Family Medicine

## 2020-07-16 DIAGNOSIS — E782 Mixed hyperlipidemia: Secondary | ICD-10-CM

## 2020-09-01 ENCOUNTER — Other Ambulatory Visit: Payer: Self-pay | Admitting: Family Medicine

## 2020-09-07 ENCOUNTER — Other Ambulatory Visit: Payer: Self-pay | Admitting: Family Medicine

## 2020-10-25 ENCOUNTER — Other Ambulatory Visit: Payer: Self-pay | Admitting: Family Medicine

## 2020-11-06 ENCOUNTER — Other Ambulatory Visit: Payer: Self-pay | Admitting: Family Medicine

## 2020-11-23 IMAGING — CT CT RENAL STONE PROTOCOL
2 of 4 series · 17 of 46 positions shown, 19 images · non-contrast
Comparison: None.

CLINICAL DATA: Hematuria

EXAM:
CT ABDOMEN AND PELVIS WITHOUT CONTRAST
TECHNIQUE: Multidetector CT imaging of the abdomen and pelvis was performed
following the standard protocol without IV contrast.

[Series 2: renal stone 5.00 · axial · 0.90mm/px · z∈[-1533,-1108]mm · 14 of 95 slices shown, 16 images]
[im 5/95  soft-tissue]
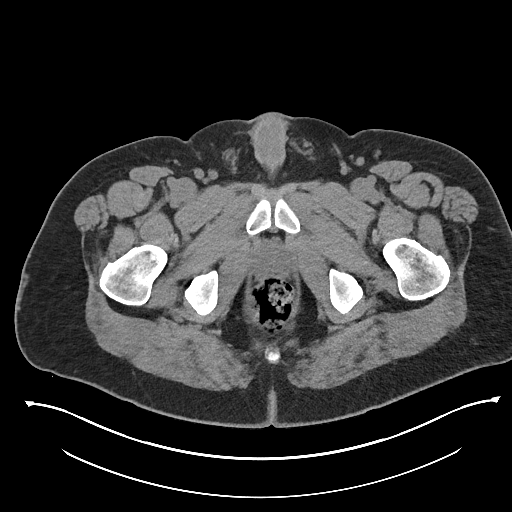
[im 5/95  bone]
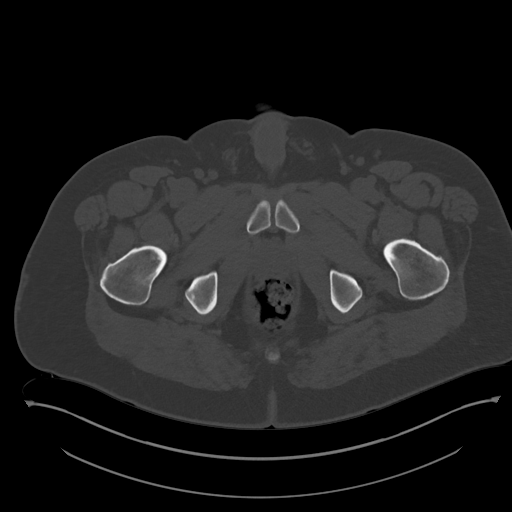
[im 13/95  soft-tissue]
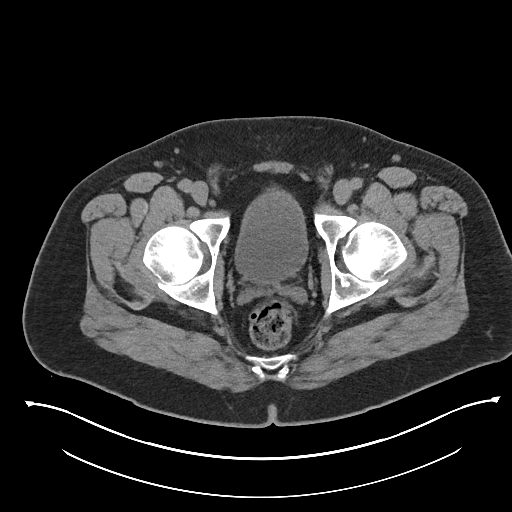
[im 17/95  soft-tissue]
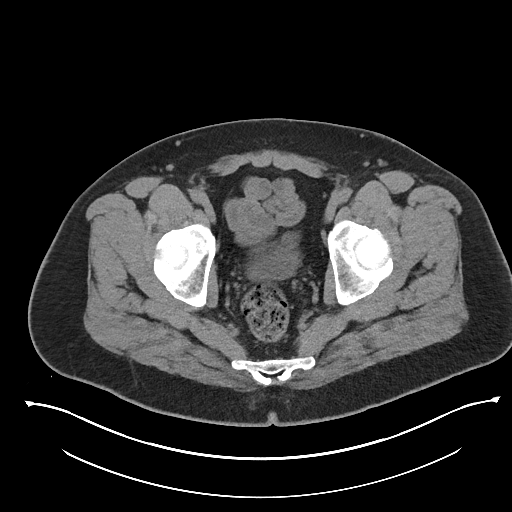
[im 25/95  soft-tissue]
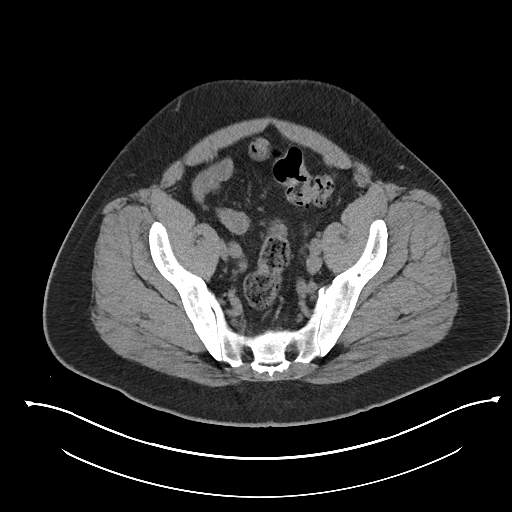
[im 33/95  soft-tissue]
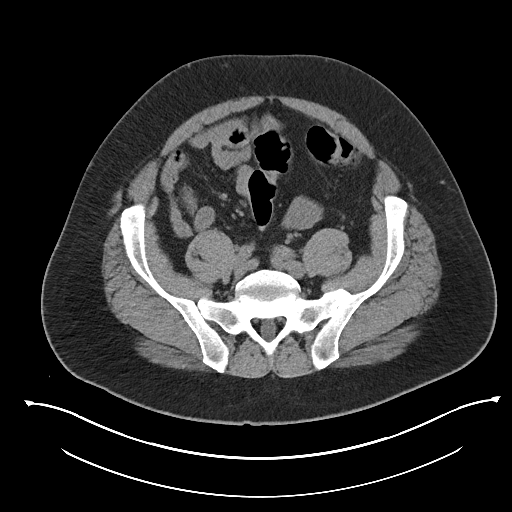
[im 37/95  soft-tissue]
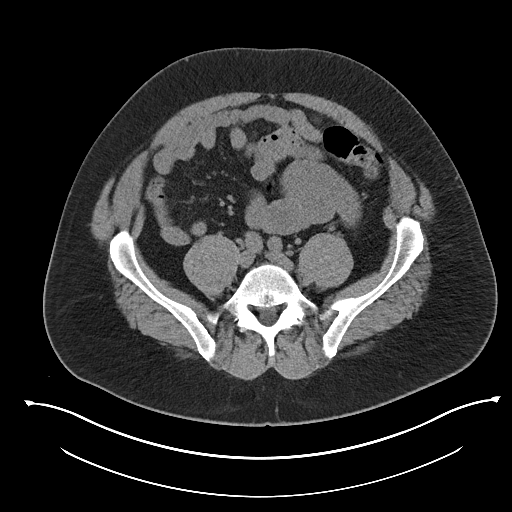
[im 45/95  soft-tissue]
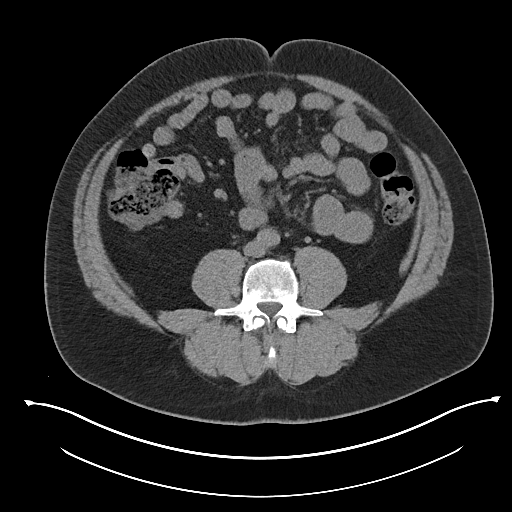
[im 50/95  soft-tissue]
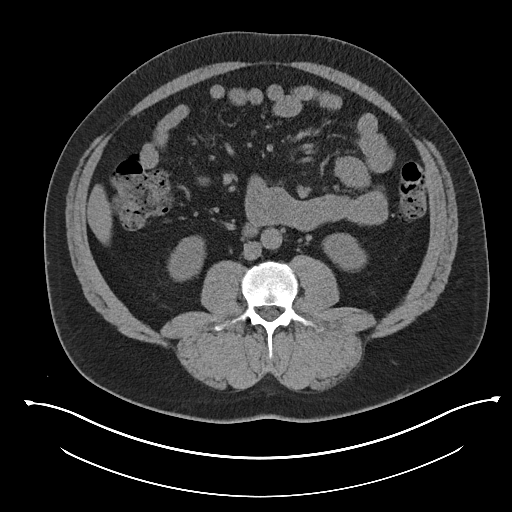
[im 58/95  soft-tissue]
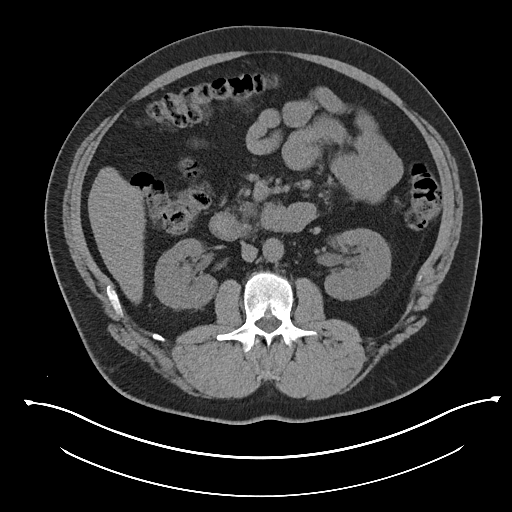
[im 58/95  bone]
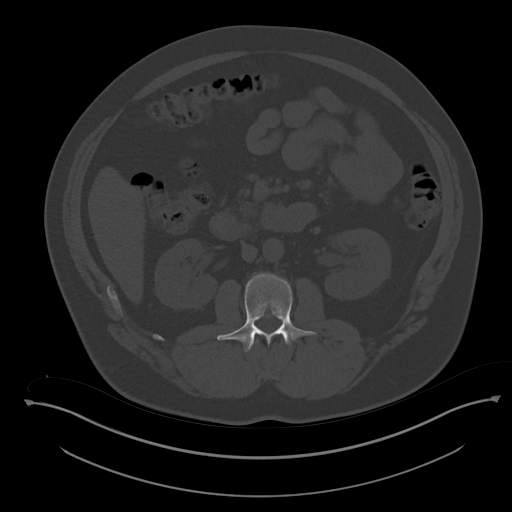
[im 62/95  soft-tissue]
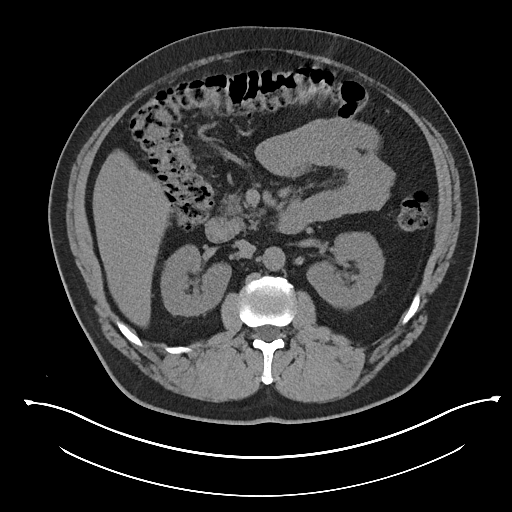
[im 70/95  soft-tissue]
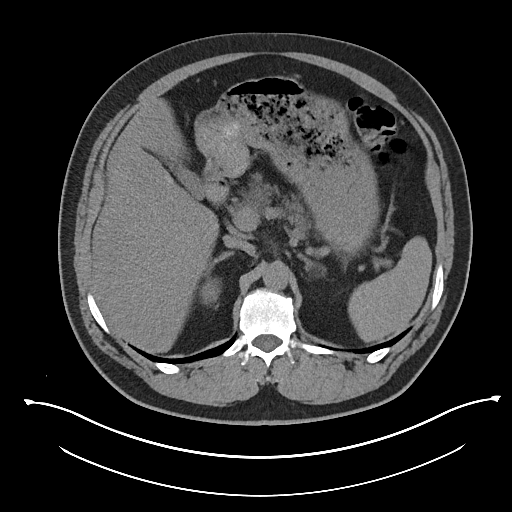
[im 78/95  soft-tissue]
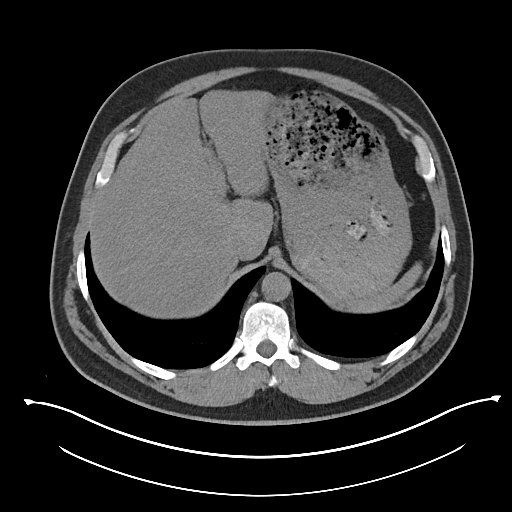
[im 82/95  soft-tissue]
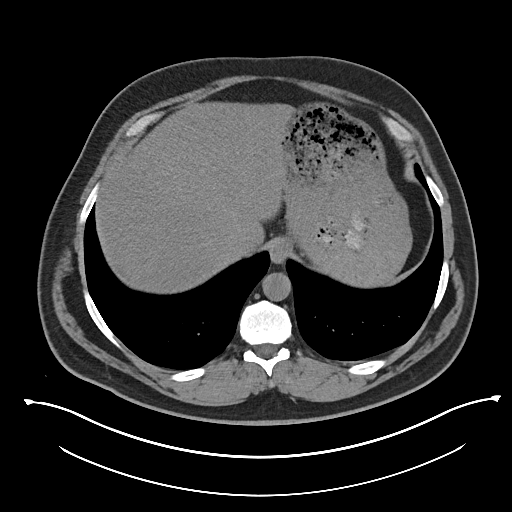
[im 90/95  soft-tissue]
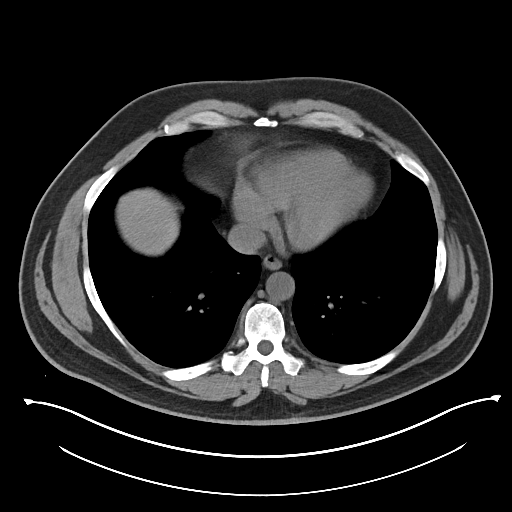

[Series 4: renal stone 2.00 cor · coronal · 0.89mm/px · 3 of 179 slices shown]
[im 60/179  soft-tissue]
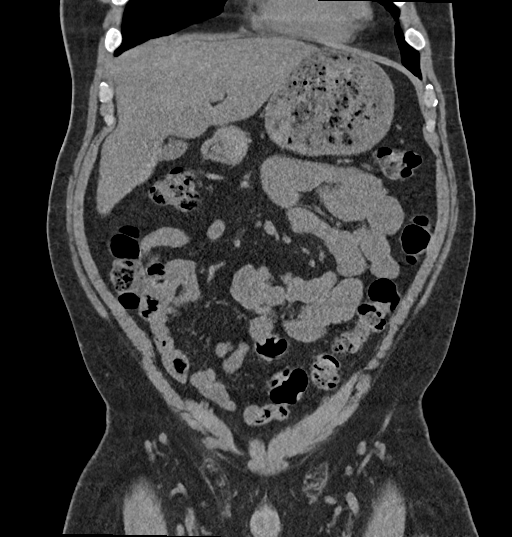
[im 80/179  soft-tissue]
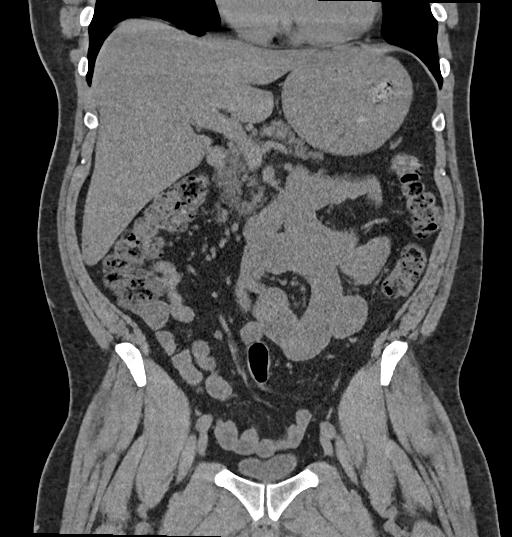
[im 99/179  soft-tissue]
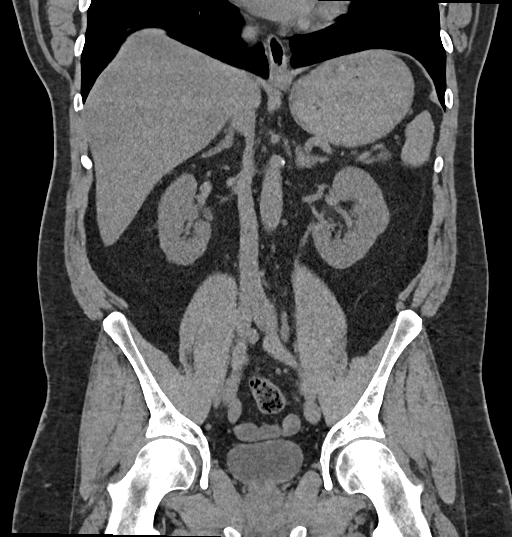

[17 of 46 positions shown; findings below may reference images not displayed]

FINDINGS: Lower chest: Lung bases are clear. No effusions. Heart is normal
size.

Hepatobiliary: Diffuse low-density throughout the liver compatible
with fatty infiltration. No focal abnormality. Gallbladder
unremarkable.

Pancreas: No focal abnormality or ductal dilatation.

Spleen: No focal abnormality.  Normal size.

Adrenals/Urinary Tract: No adrenal abnormality. No focal renal
abnormality. No stones or hydronephrosis. Urinary bladder is
unremarkable.

Stomach/Bowel: Stomach, large and small bowel grossly unremarkable.
Appendix is normal.

Vascular/Lymphatic: No evidence of aneurysm or adenopathy.

Reproductive: No visible focal abnormality.

Other: No free fluid or free air.

Musculoskeletal: No acute bony abnormality.
IMPRESSION: No renal or ureteral stones.  No hydronephrosis.

Hepatic steatosis.

## 2020-11-29 ENCOUNTER — Other Ambulatory Visit: Payer: Self-pay | Admitting: Family Medicine

## 2020-11-30 ENCOUNTER — Encounter: Payer: Self-pay | Admitting: *Deleted

## 2020-11-30 NOTE — Telephone Encounter (Signed)
30 day supply sent in. Please continue to call the patient to try to get him scheduled.

## 2020-11-30 NOTE — Telephone Encounter (Signed)
Patient has not been seen since 10/21 I have called and tried to schedule patient appointment no answer okay to fill for 30 days and try to schedule.

## 2020-11-30 NOTE — Telephone Encounter (Signed)
Pt called and states that he will look at his calendar and call back next week to schedule an appt

## 2020-11-30 NOTE — Telephone Encounter (Signed)
Sent patient a mychart message to schedule an appt for further refills.

## 2020-12-01 ENCOUNTER — Other Ambulatory Visit: Payer: Self-pay | Admitting: Family Medicine

## 2020-12-18 ENCOUNTER — Other Ambulatory Visit: Payer: Self-pay | Admitting: Family Medicine

## 2021-01-17 ENCOUNTER — Telehealth: Payer: Self-pay | Admitting: Family Medicine

## 2021-01-17 NOTE — Telephone Encounter (Signed)
I called and spoke with the patient and he stated he had to look at his schedule and he will call back to schedule a follow up visit with the provider.  Alexandrina Fiorini,cma

## 2021-01-17 NOTE — Telephone Encounter (Signed)
This patient is overdue for follow-up.  He had labs ordered last year that were not completed.  Please try to call him and get him scheduled for follow-up so we can complete labs and follow-up on his medical issues.  Thanks.

## 2021-01-27 ENCOUNTER — Telehealth: Payer: Self-pay | Admitting: Family Medicine

## 2021-01-27 NOTE — Telephone Encounter (Signed)
This patient is needing a visit for smoking cessation for his job before March 03, 2021 and he needs medication and a physical as well he has not been seen in 1 year and wants to know if you can find a appointment slot for him before December 1,2022.  Please advise.  Bradley Stevens,cma

## 2021-01-27 NOTE — Telephone Encounter (Signed)
Patient called in to schedule an appointment with Dr.Sonnenberg for a physical but his next opening is not until December and the patient would like to know if he can come in sooner due to a medicine refill.He will also be out of his rosuvastatin (CRESTOR) 20 MG tablet and empagliflozin (JARDIANCE) 10 MG TABS tablet and is requesting a 60 day supply.Please advise.

## 2021-01-28 MED ORDER — EMPAGLIFLOZIN 10 MG PO TABS: 10.0000 mg | ORAL_TABLET | Freq: Every day | ORAL | 0 refills | Status: DC

## 2021-01-28 MED ORDER — ROSUVASTATIN CALCIUM 20 MG PO TABS: 20.0000 mg | ORAL_TABLET | Freq: Every day | ORAL | 0 refills | Status: DC

## 2021-01-28 NOTE — Telephone Encounter (Signed)
I called the patient and informed him that the provider gave him a slot on November 15 @ 1 pm and he understood and accepted this appointment time and date.  Reanne Nellums,cma

## 2021-01-28 NOTE — Telephone Encounter (Signed)
I can see him on 11/15 at 1 pm. The refills were sent in for 30 days.

## 2021-02-15 ENCOUNTER — Ambulatory Visit (INDEPENDENT_AMBULATORY_CARE_PROVIDER_SITE_OTHER): Payer: BC Managed Care – PPO | Admitting: Family Medicine

## 2021-02-15 ENCOUNTER — Other Ambulatory Visit: Payer: Self-pay

## 2021-02-15 ENCOUNTER — Encounter: Payer: Self-pay | Admitting: Family Medicine

## 2021-02-15 VITALS — BP 122/86 | HR 96 | Temp 97.4°F | Resp 16 | Ht 74.0 in | Wt 265.2 lb

## 2021-02-15 DIAGNOSIS — Z125 Encounter for screening for malignant neoplasm of prostate: Secondary | ICD-10-CM | POA: Diagnosis not present

## 2021-02-15 DIAGNOSIS — E119 Type 2 diabetes mellitus without complications: Secondary | ICD-10-CM

## 2021-02-15 DIAGNOSIS — E782 Mixed hyperlipidemia: Secondary | ICD-10-CM

## 2021-02-15 DIAGNOSIS — Z0001 Encounter for general adult medical examination with abnormal findings: Secondary | ICD-10-CM | POA: Diagnosis not present

## 2021-02-15 DIAGNOSIS — F17220 Nicotine dependence, chewing tobacco, uncomplicated: Secondary | ICD-10-CM

## 2021-02-15 DIAGNOSIS — L989 Disorder of the skin and subcutaneous tissue, unspecified: Secondary | ICD-10-CM | POA: Insufficient documentation

## 2021-02-15 LAB — LIPID PANEL
Cholesterol: 78 mg/dL (ref 0–200)
HDL: 31.9 mg/dL — ABNORMAL LOW (ref >39.00)
LDL Cholesterol: 20 mg/dL (ref 0–99)
NonHDL: 46.08
Total CHOL/HDL Ratio: 2
Triglycerides: 129 mg/dL (ref 0.0–149.0)
VLDL: 25.8 mg/dL (ref 0.0–40.0)

## 2021-02-15 LAB — COMPREHENSIVE METABOLIC PANEL
ALT: 14 U/L (ref 0–53)
AST: 13 U/L (ref 0–37)
Albumin: 4.5 g/dL (ref 3.5–5.2)
Alkaline Phosphatase: 78 U/L (ref 39–117)
BUN: 16 mg/dL (ref 6–23)
CO2: 29 mEq/L (ref 19–32)
Calcium: 9.5 mg/dL (ref 8.4–10.5)
Chloride: 100 mEq/L (ref 96–112)
Creatinine, Ser: 1.35 mg/dL (ref 0.40–1.50)
GFR: 60.71 mL/min (ref >60.00)
Glucose, Bld: 150 mg/dL — ABNORMAL HIGH (ref 70–99)
Potassium: 4.1 mEq/L (ref 3.5–5.1)
Sodium: 138 mEq/L (ref 135–145)
Total Bilirubin: 0.6 mg/dL (ref 0.2–1.2)
Total Protein: 6.6 g/dL (ref 6.0–8.3)

## 2021-02-15 LAB — PSA: PSA: 0.56 ng/mL (ref 0.10–4.00)

## 2021-02-15 NOTE — Assessment & Plan Note (Signed)
Smoking cessation counseling was provided.  Approximately 3 minutes were spent discussing the rationale for tobacco cessation and strategies for doing so.  Adjuncts, including nicotine lozenges were recommended.  The patient notes he is going to try to taper down on his chewing tobacco use.  He notes Wellbutrin was not beneficial previously.  We will follow-up on this in 6 months.

## 2021-02-15 NOTE — Progress Notes (Signed)
Bradley Rumps, MD Phone: [phone]  Bradley Stevens is a 50 y.o. male who presents today for CPE.  Diet: eats out a lot as his wife eats out, he does try to get healthier options, 1/2 gallon of a sugary drink per meal, he is working on cutting down on this Exercise: on his feet 8-12 hours 3 days per week Colonoscopy: due Prostate cancer screening: due Family history-  Prostate cancer: no  Colon cancer: no Vaccines-   Flu: 3 weeks ago  Tetanus: UTD  Shingles: defers  COVID19: x3, declines further vaccines  Pneumonia: declines HIV screening: declined previously Hep C Screening: declined, low risk Tobacco use: 1/2 can chewing tobacco per day Alcohol use: no Illicit Drug use: no Dentist: due Ophthalmology: due  Skin lesions: Patient notes several dark skin lesions on his chest and back that seem to have gotten larger.  He also has several cysts on his back that he would like to have removed at some point.   Active Ambulatory Problems    Diagnosis Date Noted   Obesity (BMI 30-39.9) 06/26/2016   Encounter for general adult medical examination with abnormal findings 06/26/2016   Essential hypertension 06/26/2016   DM (diabetes mellitus), type 2 ([place]) 07/17/2016   Mixed hyperlipidemia 07/17/2016   Achrochordon 04/20/2017   Nicotine dependence, chewing tobacco, uncomplicated 77/41/2878   Cramps, extremity 06/16/2018   Left foot pain 06/16/2018   Epidermal cyst 06/16/2018   Jock itch 06/16/2018   Anxiety and depression 12/11/2018   Erectile dysfunction 12/11/2018   Back strain 12/11/2018   Lightheadedness 12/11/2018   Right elbow pain 03/18/2019   Onychomycosis 07/22/2019   Hypertension associated with diabetes ([place]) 12/12/2019   Allergic rhinitis 01/21/2020   Hematuria 01/21/2020   Colon cancer screening 01/21/2020   Skin lesions 02/15/2021   Resolved Ambulatory Problems    Diagnosis Date Noted   No Resolved Ambulatory Problems   Past Medical History:   Diagnosis Date   UTI (urinary tract infection)     Family History  Problem Relation Age of Onset   Arthritis Mother    Breast cancer Mother    Hyperlipidemia Mother    Heart disease Mother    Hypertension Mother    Mental illness Mother    Lung cancer Father    Arthritis Maternal Grandmother    Stroke Maternal Grandmother    Mental illness Maternal Grandmother    Alcohol abuse Maternal Grandfather    Arthritis Maternal Grandfather    Hyperlipidemia Maternal Grandfather    Heart disease Maternal Grandfather    Hypertension Maternal Grandfather    Diabetes Maternal Grandfather    Arthritis Paternal Grandmother    Diabetes Paternal Grandmother    Arthritis Paternal Grandfather    Lung cancer Paternal Grandfather     Social History   Socioeconomic History   Marital status: Married    Spouse name: Not on file   Number of children: Not on file   Years of education: Not on file   Highest education level: Not on file  Occupational History   Not on file  Tobacco Use   Smoking status: Never   Smokeless tobacco: Current    Types: Snuff  Substance and Sexual Activity   Alcohol use: No   Drug use: No   Sexual activity: Yes    Partners: Female  Other Topics Concern   Not on file  Social History Narrative   Not on file   Social Determinants of Health   Financial Resource Strain:  Not on file  Food Insecurity: Not on file  Transportation Needs: Not on file  Physical Activity: Not on file  Stress: Not on file  Social Connections: Not on file  Intimate Partner Violence: Not on file    ROS  General:  Negative for nexplained weight loss, fever Skin: Positive for new or changing mole, negative for sore that won't heal HEENT: Negative for trouble hearing, trouble seeing, ringing in ears, mouth sores, hoarseness, change in voice, dysphagia. CV:  Negative for chest pain, dyspnea, edema, palpitations Resp: Negative for cough, dyspnea, hemoptysis GI: Negative for nausea,  vomiting, diarrhea, constipation, abdominal pain, melena, hematochezia. GU: Negative for dysuria, incontinence, urinary hesitance, hematuria, vaginal or penile discharge, polyuria, sexual difficulty, lumps in testicle or breasts MSK: Negative for muscle cramps or aches, joint pain or swelling Neuro: Negative for headaches, weakness, numbness, dizziness, passing out/fainting Psych: Negative for depression, anxiety, memory problems  Objective  Physical Exam Vitals:   02/15/21 1309  BP: 122/86  Pulse: 96  Resp: 16  Temp: (!) 97.4 F (36.3 C)  SpO2: 99%    BP Readings from Last 3 Encounters:  02/15/21 122/86  12/12/19 116/80  07/22/19 120/80   Wt Readings from Last 3 Encounters:  02/15/21 265 lb 4 oz (120.3 kg)  01/21/20 280 lb (127 kg)  12/12/19 275 lb 12.8 oz (125.1 kg)    Physical Exam Constitutional:      General: He is not in acute distress.    Appearance: He is not diaphoretic.  HENT:     Head: Normocephalic and atraumatic.  Cardiovascular:     Rate and Rhythm: Normal rate and regular rhythm.     Heart sounds: Normal heart sounds.  Pulmonary:     Effort: Pulmonary effort is normal.     Breath sounds: Normal breath sounds.  Abdominal:     General: Bowel sounds are normal. There is no distension.     Palpations: Abdomen is soft.     Tenderness: There is no abdominal tenderness. There is no guarding or rebound.  Musculoskeletal:     Right lower leg: No edema.     Left lower leg: No edema.  Lymphadenopathy:     Cervical: No cervical adenopathy.  Skin:    General: Skin is warm and dry.       Neurological:     Mental Status: He is alert.  Psychiatric:        Mood and Affect: Mood normal.     Assessment/Plan:   Problem List Items Addressed This Visit     DM (diabetes mellitus), type 2 ([place])   Relevant Orders   HgB A1c   Encounter for general adult medical examination with abnormal findings - Primary    Physical exam completed.  The patient was advised  to continue adequate activity.  Discussed healthy diet.  Advised to make his good choices as he is able to make given his constraints.  Discussed reducing his sugary drink intake.  He will check with his wife on the GI physician he would like to see and he will send me a MyChart message.  At that time a referral to GI will be placed.  Prostate cancer screening completed with PSA.  He declines pneumonia vaccine and further COVID-19 vaccines.  He will check with his insurance on the Shingrix vaccine.  He will see a dentist and an ophthalmologist in the new year.      Mixed hyperlipidemia   Relevant Orders   Comp Met (CMET)  Lipid panel   Nicotine dependence, chewing tobacco, uncomplicated    Smoking cessation counseling was provided.  Approximately 3 minutes were spent discussing the rationale for tobacco cessation and strategies for doing so.  Adjuncts, including nicotine lozenges were recommended.  The patient notes he is going to try to taper down on his chewing tobacco use.  He notes Wellbutrin was not beneficial previously.  We will follow-up on this in 6 months.       Skin lesions    Discussed that 2 of his skin lesions appeared to be seborrheic keratoses.  I also discussed that 2 of them appeared to be nevi.  They all appeared generally benign though I did discuss having him see dermatology for evaluation of these lesions.  Discussed that they could also take care of his subcutaneous cysts that are intermittently causing an issue for him.  He would like to do this in the new year and he will contact us and let us know when he would like to see them.      Other Visit Diagnoses     Prostate cancer screening       Relevant Orders   PSA       Return in about 6 months (around 08/15/2021) for Diabetes/hypertension.  This visit occurred during the SARS-CoV-2 public health emergency.  Safety protocols were in place, including screening questions prior to the visit, additional usage of staff  PPE, and extensive cleaning of exam room while observing appropriate contact time as indicated for disinfecting solutions.    Bradley Rumps, MD [place]

## 2021-02-15 NOTE — Assessment & Plan Note (Signed)
Discussed that 2 of his skin lesions appeared to be seborrheic keratoses.  I also discussed that 2 of them appeared to be nevi.  They all appeared generally benign though I did discuss having him see dermatology for evaluation of these lesions.  Discussed that they could also take care of his subcutaneous cysts that are intermittently causing an issue for him.  He would like to do this in the new year and he will contact us and let us know when he would like to see them.

## 2021-02-15 NOTE — Assessment & Plan Note (Signed)
Physical exam completed.  The patient was advised to continue adequate activity.  Discussed healthy diet.  Advised to make his good choices as he is able to make given his constraints.  Discussed reducing his sugary drink intake.  He will check with his wife on the GI physician he would like to see and he will send me a MyChart message.  At that time a referral to GI will be placed.  Prostate cancer screening completed with PSA.  He declines pneumonia vaccine and further COVID-19 vaccines.  He will check with his insurance on the Shingrix vaccine.  He will see a dentist and an ophthalmologist in the new year.

## 2021-02-15 NOTE — Patient Instructions (Addendum)
Nice to see you. Please continue to work on dietary changes and your activity level. We will get lab work today. Please continue to cut down on chewing tobacco use. Please let me know which GI physician you would like to see. Please let me know when you would like to see dermatology.

## 2021-02-16 LAB — HEMOGLOBIN A1C: Hgb A1c MFr Bld: 7.4 % — ABNORMAL HIGH (ref 4.6–6.5)

## 2021-02-18 ENCOUNTER — Telehealth: Payer: Self-pay

## 2021-02-18 NOTE — Telephone Encounter (Signed)
LMTCB for lab results.  

## 2021-02-21 ENCOUNTER — Other Ambulatory Visit: Payer: Self-pay | Admitting: Family Medicine

## 2021-02-22 ENCOUNTER — Other Ambulatory Visit: Payer: Self-pay

## 2021-02-22 DIAGNOSIS — E119 Type 2 diabetes mellitus without complications: Secondary | ICD-10-CM

## 2021-02-22 MED ORDER — EMPAGLIFLOZIN 25 MG PO TABS: 25.0000 mg | ORAL_TABLET | Freq: Every day | ORAL | 3 refills | Status: DC

## 2021-02-22 NOTE — Progress Notes (Signed)
I called and spoke with the patient and informed him of his lab results and he understood.  I sent in the new Rx for the Jardiance for the patient to start taking.  Aliciana Ricciardi,cma

## 2021-03-29 ENCOUNTER — Other Ambulatory Visit: Payer: Self-pay | Admitting: Family Medicine

## 2021-05-04 ENCOUNTER — Telehealth: Payer: Self-pay | Admitting: Family Medicine

## 2021-05-04 ENCOUNTER — Other Ambulatory Visit: Payer: Self-pay | Admitting: Family Medicine

## 2021-05-04 NOTE — Telephone Encounter (Signed)
His A1c was checked on 02/15/21. It would be too soon to check it currently. It has to be at least 90 days between checks, so he would be able to have that checked on 2/15 or later. He could be scheduled to follow-up with me for his BP.

## 2021-05-04 NOTE — Telephone Encounter (Signed)
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 Patient is requesting to have his hemoglobin and A1C checked as well as his BP.

## 2021-05-04 NOTE — Telephone Encounter (Signed)
I called the patient and he is scheduled for a follow up on BP and A1c on 05/18/2021.  Aleynah Rocchio,cma

## 2021-05-06 NOTE — Telephone Encounter (Signed)
Okay to fill last OV 02/15/21 but I am not familiar with this antifungal?

## 2021-05-06 NOTE — Telephone Encounter (Signed)
Can you call the patient and find out why he requested this refill now?  He reported he was not using it back in November.  Thanks.

## 2021-05-17 LAB — HM DIABETES EYE EXAM

## 2021-05-18 ENCOUNTER — Ambulatory Visit: Payer: BC Managed Care – PPO | Admitting: Family Medicine

## 2021-05-18 ENCOUNTER — Encounter: Payer: Self-pay | Admitting: Family Medicine

## 2021-05-18 ENCOUNTER — Other Ambulatory Visit: Payer: Self-pay

## 2021-05-18 VITALS — BP 130/80 | HR 79 | Temp 97.8°F | Ht 74.0 in | Wt 257.8 lb

## 2021-05-18 DIAGNOSIS — I10 Essential (primary) hypertension: Secondary | ICD-10-CM

## 2021-05-18 DIAGNOSIS — E119 Type 2 diabetes mellitus without complications: Secondary | ICD-10-CM | POA: Diagnosis not present

## 2021-05-18 DIAGNOSIS — B356 Tinea cruris: Secondary | ICD-10-CM

## 2021-05-18 DIAGNOSIS — G4733 Obstructive sleep apnea (adult) (pediatric): Secondary | ICD-10-CM | POA: Diagnosis not present

## 2021-05-18 DIAGNOSIS — E782 Mixed hyperlipidemia: Secondary | ICD-10-CM

## 2021-05-18 LAB — POCT GLYCOSYLATED HEMOGLOBIN (HGB A1C): Hemoglobin A1C: 6.2 % — AB (ref 4.0–5.6)

## 2021-05-18 MED ORDER — KETOCONAZOLE 2 % EX CREA: 1.0000 "application " | TOPICAL_CREAM | Freq: Every day | CUTANEOUS | 11 refills | Status: DC

## 2021-05-18 NOTE — Addendum Note (Signed)
Addended by: Leone Haven on: 05/18/2021 09:16 AM   Modules accepted: Orders

## 2021-05-18 NOTE — Addendum Note (Signed)
Addended by: Glori Luis on: 05/18/2021 09:42 AM   Modules accepted: Orders

## 2021-05-18 NOTE — Assessment & Plan Note (Signed)
Well-controlled.  He will continue amlodipine 5 mg daily and losartan 100 mg daily. 

## 2021-05-18 NOTE — Assessment & Plan Note (Signed)
Continue crestor. LDL is well controlled.

## 2021-05-18 NOTE — Progress Notes (Addendum)
[name], MD Phone: [phone]  Bradley Stevens is a 51 y.o. male who presents today for f/u.  HYPERTENSION Disease Monitoring: Blood pressure range-not checking Chest pain- no      Dyspnea- no Medications: Compliance- taking amlodipine, losartan   Edema- no  DIABETES Disease Monitoring: Blood Sugar ranges-not checking, not required Polyuria/phagia/dipsia- no      Optho- UTD, saw yesterday Medications: Compliance- taking jardiance, metformin Hypoglycemic symptoms- no The patient has adjusted his diet and is taking in less soda and sweet tea. He is eating healthier.   HYPERLIPIDEMIA Disease Monitoring: See symptoms for Hypertension Medications: Compliance- taking crestor Right upper quadrant pain- no  Muscle aches- no  OSA CPAP use: uses when he gets in bed to sleep, notes there are nights that he goes to sleep late and wakes early so he does not get in bed Hypersomnia: some Well rested: to a certain extent CPAP company: patient will get the compliance report  Jock itch: notes no current issues with this though does not it flares up at times. He is requesting a refill on the ketoconazole cream.   Social History   Tobacco Use  Smoking Status Never  Smokeless Tobacco Current   Types: Snuff    Current Outpatient Medications on File Prior to Visit  Medication Sig Dispense Refill   amLODipine (NORVASC) 5 MG tablet TAKE 1 TABLET BY MOUTH EVERY DAY 90 tablet 3   aspirin EC 81 MG tablet Take 81 mg by mouth daily.     buPROPion (WELLBUTRIN XL) 150 MG 24 hr tablet TAKE 1 TABLET BY MOUTH EVERY DAY 90 tablet 0   cyclobenzaprine (FLEXERIL) 10 MG tablet Take 1 tablet (10 mg total) by mouth 3 (three) times daily as needed for muscle spasms. 10 tablet 0   empagliflozin (JARDIANCE) 25 MG TABS tablet Take 1 tablet (25 mg total) by mouth daily. Call to schedule an appointment for further refills. 30 tablet 3   losartan (COZAAR) 100 MG tablet TAKE 1 TABLET BY MOUTH EVERY DAY 90  tablet 3   metFORMIN (GLUCOPHAGE) 1000 MG tablet TAKE 1 TABLET (1,000 MG TOTAL) BY MOUTH 2 (TWO) TIMES DAILY WITH A MEAL. 180 tablet 1   rosuvastatin (CRESTOR) 20 MG tablet TAKE 1 TABLET BY MOUTH EVERY DAY 90 tablet 3   No current facility-administered medications on file prior to visit.     ROS see history of present illness  Objective  Physical Exam Vitals:   05/18/21 0854  BP: 130/80  Pulse: 79  Temp: 97.8 F (36.6 C)  SpO2: 98%    BP Readings from Last 3 Encounters:  05/18/21 130/80  02/15/21 122/86  12/12/19 116/80   Wt Readings from Last 3 Encounters:  05/18/21 257 lb 12.8 oz (116.9 kg)  02/15/21 265 lb 4 oz (120.3 kg)  01/21/20 280 lb (127 kg)    Physical Exam Constitutional:      General: He is not in acute distress.    Appearance: He is not diaphoretic.  Cardiovascular:     Rate and Rhythm: Normal rate and regular rhythm.     Heart sounds: Normal heart sounds.  Pulmonary:     Effort: Pulmonary effort is normal.     Breath sounds: Normal breath sounds.  Skin:    General: Skin is warm and dry.  Neurological:     Mental Status: He is alert.   Diabetic Foot Exam - Simple   Simple Foot Form Diabetic Foot exam was performed with the following findings:  Yes 05/18/2021  9:06 AM  Visual Inspection No deformities, no ulcerations, no other skin breakdown bilaterally: Yes Sensation Testing Intact to touch and monofilament testing bilaterally: Yes Pulse Check Posterior Tibialis and Dorsalis pulse intact bilaterally: Yes Comments      Assessment/Plan: Please see individual problem list.  Problem List Items Addressed This Visit     DM (diabetes mellitus), type 2 (HCC) - Primary    A1c is very well controlled.  He will continue his Jardiance 25 mg once daily and metformin 1000 mg twice daily.      Relevant Orders   POCT HgB A1C (Completed)   Essential hypertension    Well controlled. He will continue amlodipine 5 mg daily and losartan 100 mg daily.        Jock itch    No current symptoms. Refill ketoconazole cream.       Relevant Medications   ketoconazole (NIZORAL) 2 % cream   Mixed hyperlipidemia    Continue crestor. LDL is well controlled.       OSA (obstructive sleep apnea)    Continues to have some hypersomnia though he does have nights where he does not sleep that many hours. He will get me a copy of his compliance report to determine if his settings need to be adjusted. Encouraged consistent use of his CPAP while sleeping.         Health Maintenance: the patient will check with his insurance on who he needs to see for a colonoscopy. He will contact me with this name. He notes he plans to quit tobacco in the next month.   Return in about 6 months (around 11/15/2021) for Diabetes/hypertension.  This visit occurred during the SARS-CoV-2 public health emergency.  Safety protocols were in place, including screening questions prior to the visit, additional usage of staff PPE, and extensive cleaning of exam room while observing appropriate contact time as indicated for disinfecting solutions.    [name], MD [facility]

## 2021-05-18 NOTE — Assessment & Plan Note (Signed)
A1c is very well controlled.  He will continue his Jardiance 25 mg once daily and metformin 1000 mg twice daily.

## 2021-05-18 NOTE — Assessment & Plan Note (Addendum)
Continues to have some hypersomnia though he does have nights where he does not sleep that many hours. He will get me a copy of his compliance report to determine if his settings need to be adjusted. Encouraged consistent use of his CPAP while sleeping.

## 2021-05-18 NOTE — Assessment & Plan Note (Signed)
No current symptoms. Refill ketoconazole cream.

## 2021-05-18 NOTE — Patient Instructions (Addendum)
Nice to see you. Your A1c is very well controlled now.  We will have you continue on Jardiance 25 mg daily and metformin 1000 mg twice daily.  Please continue with your dietary changes. Please let me know who you need to see you for your colonoscopy. Please get me a copy of your CPAP compliance report.

## 2021-06-21 ENCOUNTER — Other Ambulatory Visit: Payer: Self-pay | Admitting: Family Medicine

## 2021-06-21 DIAGNOSIS — E119 Type 2 diabetes mellitus without complications: Secondary | ICD-10-CM

## 2021-08-16 ENCOUNTER — Encounter: Payer: Self-pay | Admitting: Family Medicine

## 2021-08-16 ENCOUNTER — Ambulatory Visit: Payer: BC Managed Care – PPO | Admitting: Family Medicine

## 2021-08-16 DIAGNOSIS — E669 Obesity, unspecified: Secondary | ICD-10-CM

## 2021-08-16 DIAGNOSIS — E119 Type 2 diabetes mellitus without complications: Secondary | ICD-10-CM

## 2021-08-16 DIAGNOSIS — I10 Essential (primary) hypertension: Secondary | ICD-10-CM

## 2021-08-16 LAB — BASIC METABOLIC PANEL
BUN: 13 mg/dL (ref 6–23)
CO2: 29 mEq/L (ref 19–32)
Calcium: 9 mg/dL (ref 8.4–10.5)
Chloride: 102 mEq/L (ref 96–112)
Creatinine, Ser: 1.08 mg/dL (ref 0.40–1.50)
GFR: 79.07 mL/min (ref >60.00)
Glucose, Bld: 190 mg/dL — ABNORMAL HIGH (ref 70–99)
Potassium: 4.1 mEq/L (ref 3.5–5.1)
Sodium: 139 mEq/L (ref 135–145)

## 2021-08-16 LAB — MICROALBUMIN / CREATININE URINE RATIO
Creatinine,U: 189.2 mg/dL
Microalb Creat Ratio: 0.6 mg/g (ref 0.0–30.0)
Microalb, Ur: 1.1 mg/dL (ref 0.0–1.9)

## 2021-08-16 LAB — HEMOGLOBIN A1C: Hgb A1c MFr Bld: 6.6 % — ABNORMAL HIGH (ref 4.6–6.5)

## 2021-08-16 NOTE — Assessment & Plan Note (Signed)
Weight has trended down some.  I encouraged continued healthy activity and continuing to work on his diet. ?

## 2021-08-16 NOTE — Assessment & Plan Note (Addendum)
Check A1c and urine microalbumin creatinine ratio.  He will continue Jardiance 25 mg daily and metformin 1000 mg twice daily. ?

## 2021-08-16 NOTE — Assessment & Plan Note (Signed)
Well-controlled.  He will continue amlodipine 5 mg daily and losartan 100 mg daily.  Check labs. ?

## 2021-08-16 NOTE — Patient Instructions (Signed)
Nice to see you. °We will get lab work today and contact you with the results. °Please continue to work on healthy diet and exercise. ° °

## 2021-08-16 NOTE — Progress Notes (Signed)
?[name], MD ?Phone: [phone] ? ?Bradley Stevens is a 51 y.o. male who presents today for f/u. ? ?HYPERTENSION ?Disease Monitoring ?Home BP Monitoring not checking Chest pain- no    Dyspnea- no ?Medications ?Compliance-  taking amlodipine, losartan.  Edema- no ?BMET ?   ?Component Value Date/Time  ? NA 138 02/15/2021 1339  ? K 4.1 02/15/2021 1339  ? CL 100 02/15/2021 1339  ? CO2 29 02/15/2021 1339  ? GLUCOSE 150 (H) 02/15/2021 1339  ? BUN 16 02/15/2021 1339  ? CREATININE 1.35 02/15/2021 1339  ? CREATININE 1.01 02/01/2018 1513  ? CALCIUM 9.5 02/15/2021 1339  ? ?DIABETES ?Disease Monitoring: ?Blood Sugar ranges-not checking Polyuria/phagia/dipsia- no      Optho- UTD ?Medications: ?Compliance- taking metformin, jardiance Hypoglycemic symptoms- no ?Notes his exercise habits have been stable.  He is eating salads here and there.  He has cut back on soda and sweet tea to about half of what he was doing previously. ? ? ?Social History  ? ?Tobacco Use  ?Smoking Status Never  ?Smokeless Tobacco Current  ? Types: Snuff  ? ? ?Current Outpatient Medications on File Prior to Visit  ?Medication Sig Dispense Refill  ? amLODipine (NORVASC) 5 MG tablet TAKE 1 TABLET BY MOUTH EVERY DAY 90 tablet 3  ? aspirin EC 81 MG tablet Take 81 mg by mouth daily.    ? buPROPion (WELLBUTRIN XL) 150 MG 24 hr tablet TAKE 1 TABLET BY MOUTH EVERY DAY 90 tablet 0  ? cyclobenzaprine (FLEXERIL) 10 MG tablet Take 1 tablet (10 mg total) by mouth 3 (three) times daily as needed for muscle spasms. 10 tablet 0  ? JARDIANCE 25 MG TABS tablet TAKE 1 TABLET (25 MG TOTAL) BY MOUTH DAILY. CALL TO SCHEDULE AN APPOINTMENT FOR FURTHER REFILLS. 30 tablet 3  ? ketoconazole (NIZORAL) 2 % cream Apply 1 application topically daily. 30 g 11  ? losartan (COZAAR) 100 MG tablet TAKE 1 TABLET BY MOUTH EVERY DAY 90 tablet 3  ? metFORMIN (GLUCOPHAGE) 1000 MG tablet TAKE 1 TABLET (1,000 MG TOTAL) BY MOUTH 2 (TWO) TIMES DAILY WITH A MEAL. 180 tablet 1  ?  rosuvastatin (CRESTOR) 20 MG tablet TAKE 1 TABLET BY MOUTH EVERY DAY 90 tablet 3  ? ?No current facility-administered medications on file prior to visit.  ? ? ? ?ROS see history of present illness ? ?Objective ? ?Physical Exam ?Vitals:  ? 08/16/21 0819  ?BP: 120/80  ?Pulse: 69  ?Temp: 97.7 ?F (36.5 ?C)  ?SpO2: 99%  ? ? ?BP Readings from Last 3 Encounters:  ?08/16/21 120/80  ?05/18/21 130/80  ?02/15/21 122/86  ? ?Wt Readings from Last 3 Encounters:  ?08/16/21 258 lb 1.6 oz (117.1 kg)  ?05/18/21 257 lb 12.8 oz (116.9 kg)  ?02/15/21 265 lb 4 oz (120.3 kg)  ? ? ?Physical Exam ?Constitutional:   ?   General: He is not in acute distress. ?   Appearance: He is not diaphoretic.  ?Cardiovascular:  ?   Rate and Rhythm: Normal rate and regular rhythm.  ?   Heart sounds: Normal heart sounds.  ?Pulmonary:  ?   Effort: Pulmonary effort is normal.  ?   Breath sounds: Normal breath sounds.  ?Musculoskeletal:  ?   Right lower leg: No edema.  ?   Left lower leg: No edema.  ?Skin: ?   General: Skin is warm and dry.  ?Neurological:  ?   Mental Status: He is alert.  ? ? ? ?Assessment/Plan: Please see individual  problem list. ? ?Problem List Items Addressed This Visit   ? ? DM (diabetes mellitus), type 2 (HCC) (Chronic)  ?  Check A1c and urine microalbumin creatinine ratio.  He will continue Jardiance 25 mg daily and metformin 1000 mg twice daily. ? ?  ?  ? Relevant Orders  ? HgB A1c  ? Urine Microalbumin w/creat. ratio  ? Essential hypertension (Chronic)  ?  Well-controlled.  He will continue amlodipine 5 mg daily and losartan 100 mg daily.  Check labs. ? ?  ?  ? Relevant Orders  ? Basic Metabolic Panel (BMET)  ? Obesity (BMI 30-39.9) (Chronic)  ?  Weight has trended down some.  I encouraged continued healthy activity and continuing to work on his diet. ? ?  ?  ? ? ? ?Health Maintenance: Discussed colonoscopy.  He knows he is due for this though he needs to figure out who is in his network and is preferred.  He will let me know who he  would like to see. Patient declines Td. ? ?Return in about 6 months (around 02/16/2022) for CPE. ? ? ?[name], MD ?[facility] [place] ? ?

## 2021-08-19 ENCOUNTER — Other Ambulatory Visit: Payer: Self-pay | Admitting: Family Medicine

## 2021-08-19 DIAGNOSIS — E782 Mixed hyperlipidemia: Secondary | ICD-10-CM

## 2021-11-04 ENCOUNTER — Other Ambulatory Visit: Payer: Self-pay

## 2021-11-14 ENCOUNTER — Other Ambulatory Visit: Payer: Self-pay | Admitting: Family Medicine

## 2021-11-16 ENCOUNTER — Ambulatory Visit: Payer: BC Managed Care – PPO | Admitting: Family Medicine

## 2021-12-13 ENCOUNTER — Other Ambulatory Visit: Payer: Self-pay

## 2022-01-03 ENCOUNTER — Other Ambulatory Visit: Payer: Self-pay

## 2022-01-03 ENCOUNTER — Telehealth: Payer: Self-pay | Admitting: Family Medicine

## 2022-01-03 DIAGNOSIS — E782 Mixed hyperlipidemia: Secondary | ICD-10-CM

## 2022-01-03 DIAGNOSIS — E119 Type 2 diabetes mellitus without complications: Secondary | ICD-10-CM

## 2022-01-03 DIAGNOSIS — B356 Tinea cruris: Secondary | ICD-10-CM

## 2022-01-03 DIAGNOSIS — I10 Essential (primary) hypertension: Secondary | ICD-10-CM

## 2022-01-03 MED ORDER — ROSUVASTATIN CALCIUM 20 MG PO TABS
20.0000 mg | ORAL_TABLET | Freq: Every day | ORAL | 3 refills | Status: DC
  Filled 2022-01-03 – 2022-03-03 (×2): qty 90, 90d supply, fill #0
  Filled 2022-07-04: qty 90, 90d supply, fill #1
  Filled 2022-10-08: qty 90, 90d supply, fill #2
  Filled 2023-01-03: qty 90, 90d supply, fill #3

## 2022-01-03 MED ORDER — METFORMIN HCL 1000 MG PO TABS
1000.0000 mg | ORAL_TABLET | Freq: Two times a day (BID) | ORAL | 1 refills | Status: DC
  Filled 2022-01-03 – 2022-07-26 (×3): qty 180, 90d supply, fill #0
  Filled 2023-01-03: qty 180, 90d supply, fill #1

## 2022-01-03 MED ORDER — KETOCONAZOLE 2 % EX CREA
1.0000 | TOPICAL_CREAM | Freq: Every day | CUTANEOUS | 11 refills | Status: DC
  Filled 2022-01-03 – 2022-10-08 (×2): qty 30, 30d supply, fill #0

## 2022-01-03 MED ORDER — BUPROPION HCL ER (XL) 150 MG PO TB24
150.0000 mg | ORAL_TABLET | Freq: Every day | ORAL | 0 refills | Status: DC
  Filled 2022-01-03: qty 90, 90d supply, fill #0

## 2022-01-03 MED ORDER — EMPAGLIFLOZIN 25 MG PO TABS
ORAL_TABLET | ORAL | 3 refills | Status: DC
  Filled 2022-01-03: qty 30, 30d supply, fill #0
  Filled 2022-02-03: qty 30, 30d supply, fill #1

## 2022-01-03 MED ORDER — LOSARTAN POTASSIUM 100 MG PO TABS
100.0000 mg | ORAL_TABLET | Freq: Every day | ORAL | 3 refills | Status: DC
  Filled 2022-01-03 – 2022-03-03 (×2): qty 90, 90d supply, fill #0
  Filled 2022-07-04: qty 90, 90d supply, fill #1
  Filled 2022-10-08: qty 90, 90d supply, fill #2
  Filled 2023-01-03: qty 90, 90d supply, fill #3

## 2022-01-03 MED ORDER — AMLODIPINE BESYLATE 5 MG PO TABS
5.0000 mg | ORAL_TABLET | Freq: Every day | ORAL | 3 refills | Status: DC
  Filled 2022-01-03 – 2022-03-03 (×3): qty 90, 90d supply, fill #0
  Filled 2022-07-04: qty 90, 90d supply, fill #1
  Filled 2022-10-08: qty 90, 90d supply, fill #2
  Filled 2023-01-03: qty 90, 90d supply, fill #3

## 2022-01-03 NOTE — Telephone Encounter (Signed)
Send medications to [facility]. He is trying to get pricing on his medication. According to the patient they will not give pricing for his medication until it is called in.

## 2022-01-13 ENCOUNTER — Telehealth: Payer: Self-pay | Admitting: Family Medicine

## 2022-01-13 DIAGNOSIS — Z1211 Encounter for screening for malignant neoplasm of colon: Secondary | ICD-10-CM

## 2022-01-13 NOTE — Telephone Encounter (Signed)
Referral placed.

## 2022-01-13 NOTE — Telephone Encounter (Signed)
Pt called requesting a colonoscopy referral

## 2022-01-29 ENCOUNTER — Other Ambulatory Visit: Payer: Self-pay | Admitting: Family Medicine

## 2022-02-03 ENCOUNTER — Other Ambulatory Visit: Payer: Self-pay

## 2022-02-21 ENCOUNTER — Encounter: Payer: Self-pay | Admitting: Family Medicine

## 2022-02-21 ENCOUNTER — Ambulatory Visit (INDEPENDENT_AMBULATORY_CARE_PROVIDER_SITE_OTHER): Payer: BC Managed Care – PPO | Admitting: Family Medicine

## 2022-02-21 VITALS — BP 112/80 | HR 74 | Temp 97.9°F | Ht 74.0 in | Wt 259.4 lb

## 2022-02-21 DIAGNOSIS — Z125 Encounter for screening for malignant neoplasm of prostate: Secondary | ICD-10-CM

## 2022-02-21 DIAGNOSIS — L989 Disorder of the skin and subcutaneous tissue, unspecified: Secondary | ICD-10-CM

## 2022-02-21 DIAGNOSIS — Z0001 Encounter for general adult medical examination with abnormal findings: Secondary | ICD-10-CM

## 2022-02-21 DIAGNOSIS — E782 Mixed hyperlipidemia: Secondary | ICD-10-CM | POA: Diagnosis not present

## 2022-02-21 DIAGNOSIS — I10 Essential (primary) hypertension: Secondary | ICD-10-CM

## 2022-02-21 DIAGNOSIS — E119 Type 2 diabetes mellitus without complications: Secondary | ICD-10-CM

## 2022-02-21 LAB — COMPREHENSIVE METABOLIC PANEL
ALT: 13 U/L (ref 0–53)
AST: 14 U/L (ref 0–37)
Albumin: 4.6 g/dL (ref 3.5–5.2)
Alkaline Phosphatase: 80 U/L (ref 39–117)
BUN: 21 mg/dL (ref 6–23)
CO2: 31 mEq/L (ref 19–32)
Calcium: 9.5 mg/dL (ref 8.4–10.5)
Chloride: 102 mEq/L (ref 96–112)
Creatinine, Ser: 1 mg/dL (ref 0.40–1.50)
GFR: 86.41 mL/min (ref >60.00)
Glucose, Bld: 103 mg/dL — ABNORMAL HIGH (ref 70–99)
Potassium: 4.5 mEq/L (ref 3.5–5.1)
Sodium: 139 mEq/L (ref 135–145)
Total Bilirubin: 0.5 mg/dL (ref 0.2–1.2)
Total Protein: 6.8 g/dL (ref 6.0–8.3)

## 2022-02-21 LAB — LIPID PANEL
Cholesterol: 83 mg/dL (ref 0–200)
HDL: 37.9 mg/dL — ABNORMAL LOW (ref >39.00)
LDL Cholesterol: 28 mg/dL (ref 0–99)
NonHDL: 45.2
Total CHOL/HDL Ratio: 2
Triglycerides: 87 mg/dL (ref 0.0–149.0)
VLDL: 17.4 mg/dL (ref 0.0–40.0)

## 2022-02-21 LAB — HEMOGLOBIN A1C: Hgb A1c MFr Bld: 7.3 % — ABNORMAL HIGH (ref 4.6–6.5)

## 2022-02-21 LAB — PSA: PSA: 0.77 ng/mL (ref 0.10–4.00)

## 2022-02-21 MED ORDER — EMPAGLIFLOZIN 25 MG PO TABS: ORAL_TABLET | ORAL | 3 refills | Status: DC

## 2022-02-21 NOTE — Addendum Note (Signed)
Addended by: Birdie Sons Jolyssa Oplinger G on: 02/21/2022 01:51 PM   Modules accepted: Orders

## 2022-02-21 NOTE — Assessment & Plan Note (Signed)
Diastolic is just above goal today. He is not checking at home so we do not know what his control actually is. I have asked him to start checking at home and let me know what his readings are in 2 weeks. He will continue amlodipine 5 mg daily and losartan 100 mg daily.

## 2022-02-21 NOTE — Assessment & Plan Note (Signed)
Likely an actinic keratosis versus squamous cell carcinoma of the skin.  Discussed referral to dermatology.  He wants to look and see who he would like to see and I will send me a message when he is ready for the referral.

## 2022-02-21 NOTE — Progress Notes (Signed)
Tommi Rumps, MD Phone: [phone]  Bradley Stevens is a 51 y.o. male who presents today for CPE.  Diet: drinking less soda and sweet tea, not many sweets, eats mostly meat, vegetables, and potatoes Exercise: sprints, steps Colonoscopy: due, patient is working on getting this scheduled Prostate cancer screening: due Family history-  Prostate cancer: no  Colon cancer: no Vaccines-   Flu: declines  Tetanus: declines  Shingles: declines  COVID19:  x2 declines further vaccines HIV screening: donated blood previously Hep C Screening: donated blood previously Tobacco use: dips, plans to quit January 1 Alcohol use: no Illicit Drug use: no Dentist: yes Ophthalmology: yes Chronic joint and muscle aches from activity. Occasionally takes ibuprofen. Skin lesion on top of his head that is not resolving. Reports sun exposure in his youth.    Active Ambulatory Problems    Diagnosis Date Noted   Obesity (BMI 30-39.9) 06/26/2016   Encounter for general adult medical examination with abnormal findings 06/26/2016   Essential hypertension 06/26/2016   DM (diabetes mellitus), type 2 ([place]) 07/17/2016   Mixed hyperlipidemia 07/17/2016   Achrochordon 04/20/2017   Nicotine dependence, chewing tobacco, uncomplicated 38/33/3832   Cramps, extremity 06/16/2018   Left foot pain 06/16/2018   Epidermal cyst 06/16/2018   Jock itch 06/16/2018   Anxiety and depression 12/11/2018   Erectile dysfunction 12/11/2018   Back strain 12/11/2018   Lightheadedness 12/11/2018   Right elbow pain 03/18/2019   Onychomycosis 07/22/2019   Allergic rhinitis 01/21/2020   Hematuria 01/21/2020   Colon cancer screening 01/21/2020   Skin lesion of scalp 02/15/2021   OSA (obstructive sleep apnea) 05/18/2021   Resolved Ambulatory Problems    Diagnosis Date Noted   Hypertension associated with diabetes ([place]) 12/12/2019   Past Medical History:  Diagnosis Date   UTI (urinary tract infection)     Family  History  Problem Relation Age of Onset   Arthritis Mother    Breast cancer Mother    Hyperlipidemia Mother    Heart disease Mother    Hypertension Mother    Mental illness Mother    Lung cancer Father    Arthritis Maternal Grandmother    Stroke Maternal Grandmother    Mental illness Maternal Grandmother    Alcohol abuse Maternal Grandfather    Arthritis Maternal Grandfather    Hyperlipidemia Maternal Grandfather    Heart disease Maternal Grandfather    Hypertension Maternal Grandfather    Diabetes Maternal Grandfather    Arthritis Paternal Grandmother    Diabetes Paternal Grandmother    Arthritis Paternal Grandfather    Lung cancer Paternal Grandfather     Social History   Socioeconomic History   Marital status: Married    Spouse name: Not on file   Number of children: Not on file   Years of education: Not on file   Highest education level: Not on file  Occupational History   Not on file  Tobacco Use   Smoking status: Never   Smokeless tobacco: Current    Types: Snuff  Substance and Sexual Activity   Alcohol use: No   Drug use: No   Sexual activity: Yes    Partners: Female  Other Topics Concern   Not on file  Social History Narrative   Not on file   Social Determinants of Health   Financial Resource Strain: Not on file  Food Insecurity: Not on file  Transportation Needs: Not on file  Physical Activity: Not on file  Stress: Not on file  Social  Connections: Not on file  Intimate Partner Violence: Not on file    ROS  General:  Negative for nexplained weight loss, fever Skin: Positive for sore the heel, negative for new or changing mole HEENT: Negative for trouble hearing, trouble seeing, ringing in ears, mouth sores, hoarseness, change in voice, dysphagia. CV:  Negative for chest pain, dyspnea, edema, palpitations Resp: Negative for cough, dyspnea, hemoptysis GI: Negative for nausea, vomiting, diarrhea, constipation, abdominal pain, melena,  hematochezia. GU: Negative for dysuria, incontinence, urinary hesitance, hematuria, vaginal or penile discharge, polyuria, sexual difficulty, lumps in testicle or breasts MSK: Positive for muscle cramps or aches, joint pain or swelling Neuro: Negative for headaches, weakness, numbness, dizziness, passing out/fainting Psych: Negative for depression, anxiety, memory problems  Objective  Physical Exam Vitals:   02/21/22 0909 02/21/22 0931  BP: 128/80 112/80  Pulse: 74   Temp: 97.9 F (36.6 C)   SpO2: 95%     BP Readings from Last 3 Encounters:  02/21/22 112/80  08/16/21 120/80  05/18/21 130/80   Wt Readings from Last 3 Encounters:  02/21/22 259 lb 6.4 oz (117.7 kg)  08/16/21 258 lb 1.6 oz (117.1 kg)  05/18/21 257 lb 12.8 oz (116.9 kg)    Physical Exam Constitutional:      General: He is not in acute distress.    Appearance: He is not diaphoretic.  HENT:     Head:   Cardiovascular:     Rate and Rhythm: Normal rate and regular rhythm.     Heart sounds: Normal heart sounds.  Pulmonary:     Effort: Pulmonary effort is normal.     Breath sounds: Normal breath sounds.  Abdominal:     General: Bowel sounds are normal. There is no distension.     Palpations: Abdomen is soft.     Tenderness: There is no abdominal tenderness.  Musculoskeletal:     Right lower leg: No edema.     Left lower leg: No edema.  Skin:    General: Skin is warm and dry.  Neurological:     Mental Status: He is alert.  Psychiatric:        Mood and Affect: Mood normal.      Assessment/Plan:   Problem List Items Addressed This Visit     DM (diabetes mellitus), type 2 ([place]) (Chronic)   Relevant Orders   HgB A1c   Essential hypertension (Chronic)    Diastolic is just above goal today. He is not checking at home so we do not know what his control actually is. I have asked him to start checking at home and let me know what his readings are in 2 weeks. He will continue amlodipine 5 mg daily and  losartan 100 mg daily.       Mixed hyperlipidemia (Chronic)   Relevant Orders   Comp Met (CMET)   Lipid panel   Encounter for general adult medical examination with abnormal findings - Primary    Physical exam completed.  Encouraged healthy diet and activity level.  He will be screened for prostate cancer with PSA today.  He will continue to work on scheduling his colonoscopy.  He declines all vaccines.  He will quit tobacco use on January 1.  Lab work as outlined.  Discussed appropriate dosing of ibuprofen 600 mg every 6 hours as needed for any muscle or joint aches.      Skin lesion of scalp    Likely an actinic keratosis versus squamous cell carcinoma of the skin.  Discussed referral to dermatology.  He wants to look and see who he would like to see and I will send me a message when he is ready for the referral.      Other Visit Diagnoses     Prostate cancer screening       Relevant Orders   PSA       Return in about 6 months (around 08/22/2022) for htn, DM.   Tommi Rumps, MD [place]

## 2022-02-21 NOTE — Assessment & Plan Note (Addendum)
Physical exam completed.  Encouraged healthy diet and activity level.  He will be screened for prostate cancer with PSA today.  He will continue to work on scheduling his colonoscopy.  He declines all vaccines.  He will quit tobacco use on January 1.  Lab work as outlined.  Discussed appropriate dosing of ibuprofen 600 mg every 6 hours as needed for any muscle or joint aches.

## 2022-02-22 ENCOUNTER — Ambulatory Visit: Payer: BC Managed Care – PPO | Admitting: Family Medicine

## 2022-03-03 ENCOUNTER — Other Ambulatory Visit: Payer: Self-pay | Admitting: Family Medicine

## 2022-03-03 ENCOUNTER — Other Ambulatory Visit: Payer: Self-pay

## 2022-03-03 DIAGNOSIS — E119 Type 2 diabetes mellitus without complications: Secondary | ICD-10-CM

## 2022-03-10 ENCOUNTER — Other Ambulatory Visit: Payer: Self-pay

## 2022-03-10 ENCOUNTER — Other Ambulatory Visit: Payer: Self-pay | Admitting: Family Medicine

## 2022-03-10 DIAGNOSIS — E119 Type 2 diabetes mellitus without complications: Secondary | ICD-10-CM

## 2022-03-10 MED ORDER — EMPAGLIFLOZIN 25 MG PO TABS
ORAL_TABLET | ORAL | 1 refills | Status: DC
  Filled 2022-03-10: qty 90, 90d supply, fill #0
  Filled 2022-07-04: qty 90, 90d supply, fill #1

## 2022-03-10 MED ORDER — BUPROPION HCL ER (XL) 150 MG PO TB24
150.0000 mg | ORAL_TABLET | Freq: Every day | ORAL | 0 refills | Status: DC
  Filled 2022-03-10: qty 90, 90d supply, fill #0

## 2022-03-10 NOTE — Telephone Encounter (Signed)
Patient is requesting the following refills, buPROPion (WELLBUTRIN XL) 150 MG 24 hr tablet and  buPROPion (WELLBUTRIN XL) 150 MG 24 hr tablet.  Patient stated he is upset, he has been trying to this refill since last week. Patient is out of his medication.

## 2022-03-10 NOTE — Addendum Note (Signed)
Addended by: Warden Fillers on: 03/10/2022 02:40 PM   Modules accepted: Orders

## 2022-03-10 NOTE — Telephone Encounter (Addendum)
I spoke with patient & he states that when he was in the office he advised the CMA that he no longer wanted to use [facility]. He needs Wellbutrin XL & Jardiance (90 day supply) sent to the [facility] pharmacy.  [facility] have called [facility] & cancelled both medications. I will send them both to the correct pharmacy

## 2022-03-10 NOTE — Addendum Note (Signed)
Addended by: Warden Fillers on: 03/10/2022 11:37 AM   Modules accepted: Orders

## 2022-07-04 ENCOUNTER — Other Ambulatory Visit: Payer: Self-pay

## 2022-07-04 ENCOUNTER — Other Ambulatory Visit: Payer: Self-pay | Admitting: Family Medicine

## 2022-07-05 ENCOUNTER — Other Ambulatory Visit: Payer: Self-pay

## 2022-07-07 ENCOUNTER — Other Ambulatory Visit: Payer: Self-pay

## 2022-07-07 MED ORDER — BUPROPION HCL ER (XL) 150 MG PO TB24
150.0000 mg | ORAL_TABLET | Freq: Every day | ORAL | 0 refills | Status: DC
  Filled 2022-07-07: qty 90, 90d supply, fill #0

## 2022-07-26 ENCOUNTER — Other Ambulatory Visit: Payer: Self-pay

## 2022-08-22 ENCOUNTER — Ambulatory Visit (INDEPENDENT_AMBULATORY_CARE_PROVIDER_SITE_OTHER): Payer: BC Managed Care – PPO | Admitting: Family Medicine

## 2022-08-22 ENCOUNTER — Encounter: Payer: Self-pay | Admitting: Family Medicine

## 2022-08-22 VITALS — BP 124/78 | HR 78 | Temp 98.3°F | Ht 74.0 in | Wt 257.6 lb

## 2022-08-22 DIAGNOSIS — I10 Essential (primary) hypertension: Secondary | ICD-10-CM

## 2022-08-22 DIAGNOSIS — E669 Obesity, unspecified: Secondary | ICD-10-CM

## 2022-08-22 DIAGNOSIS — Z7984 Long term (current) use of oral hypoglycemic drugs: Secondary | ICD-10-CM | POA: Diagnosis not present

## 2022-08-22 DIAGNOSIS — E119 Type 2 diabetes mellitus without complications: Secondary | ICD-10-CM

## 2022-08-22 LAB — MICROALBUMIN / CREATININE URINE RATIO
Creatinine,U: 118 mg/dL
Microalb Creat Ratio: 1.1 mg/g (ref 0.0–30.0)
Microalb, Ur: 1.3 mg/dL (ref 0.0–1.9)

## 2022-08-22 LAB — BASIC METABOLIC PANEL
BUN: 15 mg/dL (ref 6–23)
CO2: 29 mEq/L (ref 19–32)
Calcium: 9.5 mg/dL (ref 8.4–10.5)
Chloride: 99 mEq/L (ref 96–112)
Creatinine, Ser: 1.05 mg/dL (ref 0.40–1.50)
GFR: 81.21 mL/min (ref >60.00)
Glucose, Bld: 112 mg/dL — ABNORMAL HIGH (ref 70–99)
Potassium: 4.5 mEq/L (ref 3.5–5.1)
Sodium: 137 mEq/L (ref 135–145)

## 2022-08-22 LAB — HEMOGLOBIN A1C: Hgb A1c MFr Bld: 7.8 % — ABNORMAL HIGH (ref 4.6–6.5)

## 2022-08-22 NOTE — Assessment & Plan Note (Signed)
Chronic issue.  Check A1c.  Check urine microalbumin creatinine ratio.  He will continue Jardiance 25 mg daily and metformin 1000 mg twice daily.

## 2022-08-22 NOTE — Progress Notes (Signed)
[name], MD Phone: [phone]  Bradley Stevens is a 52 y.o. male who presents today for f/u.  HYPERTENSION Disease Monitoring Home BP Monitoring "normal" though did not provide numbers Chest pain- no    Dyspnea- no Medications Compliance-  taking amlodipine, losartan.   Edema- no BMET    Component Value Date/Time   NA 139 02/21/2022 0934   K 4.5 02/21/2022 0934   CL 102 02/21/2022 0934   CO2 31 02/21/2022 0934   GLUCOSE 103 (H) 02/21/2022 0934   BUN 21 02/21/2022 0934   CREATININE 1.00 02/21/2022 0934   CREATININE 1.01 02/01/2018 1513   CALCIUM 9.5 02/21/2022 0934   DIABETES Disease Monitoring: Blood Sugar ranges-not checking Polyuria/phagia/dipsia- no      Optho- due Medications: Compliance- taking jardiance, metformin Hypoglycemic symptoms- no  Obesity: Patient reports some weight loss.  He started a new part-time job but that is quite active he is switching jobs in the near future and knows he will need to get back into the gym.  He notes he is eating healthier than he used to.   Social History   Tobacco Use  Smoking Status Never  Smokeless Tobacco Current   Types: Snuff    Current Outpatient Medications on File Prior to Visit  Medication Sig Dispense Refill   amLODipine (NORVASC) 5 MG tablet Take 1 tablet (5 mg total) by mouth daily. 90 tablet 3   aspirin EC 81 MG tablet Take 81 mg by mouth daily.     buPROPion (WELLBUTRIN XL) 150 MG 24 hr tablet Take 1 tablet (150 mg total) by mouth daily. 90 tablet 0   cyclobenzaprine (FLEXERIL) 10 MG tablet Take 1 tablet (10 mg total) by mouth 3 (three) times daily as needed for muscle spasms. 10 tablet 0   empagliflozin (JARDIANCE) 25 MG TABS tablet TAKE 1 TABLET (25 MG TOTAL) BY MOUTH DAILY. 90 tablet 1   ketoconazole (NIZORAL) 2 % cream Apply 1 Application topically daily. 30 g 11   losartan (COZAAR) 100 MG tablet Take 1 tablet (100 mg total) by mouth daily. 90 tablet 3   metFORMIN (GLUCOPHAGE) 1000 MG tablet  Take 1 tablet (1,000 mg total) by mouth 2 (two) times daily with a meal. 180 tablet 1   rosuvastatin (CRESTOR) 20 MG tablet Take 1 tablet (20 mg total) by mouth daily. 90 tablet 3   No current facility-administered medications on file prior to visit.     ROS see history of present illness  Objective  Physical Exam Vitals:   08/22/22 0822 08/22/22 0840  BP: 130/84 124/78  Pulse: 78   Temp: 98.3 F (36.8 C)   SpO2: 98%     BP Readings from Last 3 Encounters:  08/22/22 124/78  02/21/22 112/80  08/16/21 120/80   Wt Readings from Last 3 Encounters:  08/22/22 257 lb 9.6 oz (116.8 kg)  02/21/22 259 lb 6.4 oz (117.7 kg)  08/16/21 258 lb 1.6 oz (117.1 kg)    Physical Exam Constitutional:      General: He is not in acute distress.    Appearance: He is not diaphoretic.  Cardiovascular:     Rate and Rhythm: Normal rate and regular rhythm.     Heart sounds: Normal heart sounds.  Pulmonary:     Effort: Pulmonary effort is normal.     Breath sounds: Normal breath sounds.  Skin:    General: Skin is warm and dry.  Neurological:     Mental Status: He is alert.  Diabetic Foot Exam - Simple   Simple Foot Form Diabetic Foot exam was performed with the following findings: Yes 08/22/2022  8:39 AM  Visual Inspection No deformities, no ulcerations, no other skin breakdown bilaterally: Yes Sensation Testing Intact to touch and monofilament testing bilaterally: Yes Pulse Check Posterior Tibialis and Dorsalis pulse intact bilaterally: Yes Comments      Assessment/Plan: Please see individual problem list.  Type 2 diabetes mellitus without complication, without long-term current use of insulin (HCC) Assessment & Plan: Chronic issue.  Check A1c.  Check urine microalbumin creatinine ratio.  He will continue Jardiance 25 mg daily and metformin 1000 mg twice daily.  Orders: -     Microalbumin / creatinine urine ratio -     Hemoglobin A1c  Obesity (BMI 30-39.9) Assessment &  Plan: Chronic issue.  Encouraged continued healthy diet.  Discussed remaining active and going back to the gym once he switches jobs.   Essential hypertension Assessment & Plan: Chronic issue.  At goal on recheck.  Patient will continue amlodipine 5 mg daily and losartan 100 mg daily.  Orders: -     Basic metabolic panel     Health Maintenance: Patient was encouraged to get his colonoscopy scheduled and schedule his yearly checkup with his eye doctor.  Return in about 6 months (around 02/22/2023) for physical.   [name], MD [facility]

## 2022-08-22 NOTE — Assessment & Plan Note (Signed)
Chronic issue.  At goal on recheck.  Patient will continue amlodipine 5 mg daily and losartan 100 mg daily.

## 2022-08-22 NOTE — Assessment & Plan Note (Signed)
Chronic issue.  Encouraged continued healthy diet.  Discussed remaining active and going back to the gym once he switches jobs.

## 2022-08-23 ENCOUNTER — Telehealth: Payer: Self-pay

## 2022-08-23 NOTE — Telephone Encounter (Signed)
Left message to call the office back regarding lab results  

## 2022-08-23 NOTE — Telephone Encounter (Signed)
-----   Message from Glori Luis, MD sent at 08/23/2022 11:05 AM EDT ----- Please let the patient know his A1c has worsened from 7.3-7.8.  I would like to see this number less than 7.  At this point I would suggest adding additional medication to help get his diabetes under better control.  Would he be willing to consider a once weekly injection?  If he is willing to consider this then I can send him information on MyChart for him to review to determine if he meets criteria to start this kind of medication.  If he is not willing to do an injection we could try a medicine called Tradjenta which is a pill that he would take once daily.

## 2022-08-24 ENCOUNTER — Encounter: Payer: Self-pay | Admitting: Family Medicine

## 2022-08-25 ENCOUNTER — Other Ambulatory Visit: Payer: Self-pay

## 2022-10-08 ENCOUNTER — Other Ambulatory Visit: Payer: Self-pay

## 2022-10-08 ENCOUNTER — Other Ambulatory Visit: Payer: Self-pay | Admitting: Family Medicine

## 2022-10-08 DIAGNOSIS — E119 Type 2 diabetes mellitus without complications: Secondary | ICD-10-CM

## 2022-10-09 ENCOUNTER — Other Ambulatory Visit: Payer: Self-pay

## 2022-10-09 MED ORDER — BUPROPION HCL ER (XL) 150 MG PO TB24
150.0000 mg | ORAL_TABLET | Freq: Every day | ORAL | 0 refills | Status: DC
  Filled 2022-10-09: qty 90, 90d supply, fill #0

## 2022-10-09 MED ORDER — EMPAGLIFLOZIN 25 MG PO TABS
ORAL_TABLET | ORAL | 1 refills | Status: DC
  Filled 2022-10-09: qty 90, 90d supply, fill #0
  Filled 2023-01-03: qty 90, 90d supply, fill #1

## 2022-12-06 ENCOUNTER — Other Ambulatory Visit (HOSPITAL_COMMUNITY): Payer: Self-pay

## 2023-01-03 ENCOUNTER — Other Ambulatory Visit: Payer: Self-pay | Admitting: Family Medicine

## 2023-01-03 ENCOUNTER — Other Ambulatory Visit: Payer: Self-pay

## 2023-01-03 MED ORDER — BUPROPION HCL ER (XL) 150 MG PO TB24
150.0000 mg | ORAL_TABLET | Freq: Every day | ORAL | 0 refills | Status: DC
  Filled 2023-01-03: qty 90, 90d supply, fill #0

## 2023-03-07 ENCOUNTER — Encounter: Payer: BC Managed Care – PPO | Admitting: Family Medicine

## 2023-03-14 ENCOUNTER — Encounter: Payer: Self-pay | Admitting: Family Medicine

## 2023-03-14 ENCOUNTER — Ambulatory Visit (INDEPENDENT_AMBULATORY_CARE_PROVIDER_SITE_OTHER): Payer: BC Managed Care – PPO | Admitting: Family Medicine

## 2023-03-14 ENCOUNTER — Other Ambulatory Visit: Payer: Self-pay

## 2023-03-14 VITALS — BP 118/78 | HR 76 | Temp 98.3°F | Ht 74.0 in | Wt 255.6 lb

## 2023-03-14 DIAGNOSIS — E782 Mixed hyperlipidemia: Secondary | ICD-10-CM | POA: Diagnosis not present

## 2023-03-14 DIAGNOSIS — Z0001 Encounter for general adult medical examination with abnormal findings: Secondary | ICD-10-CM | POA: Diagnosis not present

## 2023-03-14 DIAGNOSIS — Z7984 Long term (current) use of oral hypoglycemic drugs: Secondary | ICD-10-CM

## 2023-03-14 DIAGNOSIS — E119 Type 2 diabetes mellitus without complications: Secondary | ICD-10-CM

## 2023-03-14 DIAGNOSIS — Z125 Encounter for screening for malignant neoplasm of prostate: Secondary | ICD-10-CM | POA: Diagnosis not present

## 2023-03-14 DIAGNOSIS — L731 Pseudofolliculitis barbae: Secondary | ICD-10-CM | POA: Insufficient documentation

## 2023-03-14 MED ORDER — SEMAGLUTIDE(0.25 OR 0.5MG/DOS) 2 MG/3ML ~~LOC~~ SOPN
PEN_INJECTOR | SUBCUTANEOUS | 1 refills | Status: DC
Start: 2023-03-14 — End: 2023-05-25
  Filled 2023-03-14: qty 3, 42d supply, fill #0
  Filled 2023-04-13: qty 3, 42d supply, fill #1
  Filled 2023-04-16: qty 3, 28d supply, fill #1

## 2023-03-14 NOTE — Progress Notes (Addendum)
[name], MD Phone: [phone]  Bradley Stevens is a 52 y.o. male who presents today for CPE.  Diet: Has been working on [employer], drinking more water, cut back on sugary drinks, minimal sweets or junk food, try to get more fruits and vegetables Exercise: More walking through work Colonoscopy: Due Prostate cancer screening: due Family history-  Prostate cancer: no  Colon cancer: no Vaccines-   Flu: declines  Tetanus: declines  Shingles: declines  COVID19: declined in the past HIV screening: declined in past Hep C Screening: declined in past Tobacco use: notes started dipping again, thinking about quitting in the new year Alcohol use: no Illicit Drug use: no Dentist: plans to see next year Ophthalmology: plans to see next year  Patient notes a lesion on the back of his neck that is been present for 2 to 3 days.  No pain.  No drainage.  Notes it feels like it would be a zit though nothing is coming out of it.   Active Ambulatory Problems    Diagnosis Date Noted   Obesity (BMI 30-39.9) 06/26/2016   Encounter for general adult medical examination with abnormal findings 06/26/2016   Essential hypertension 06/26/2016   DM (diabetes mellitus), type 2 (HCC) 07/17/2016   Mixed hyperlipidemia 07/17/2016   Achrochordon 04/20/2017   Nicotine dependence, chewing tobacco, uncomplicated 04/20/2017   Cramps, extremity 06/16/2018   Left foot pain 06/16/2018   Epidermal cyst 06/16/2018   Jock itch 06/16/2018   Anxiety and depression 12/11/2018   Erectile dysfunction 12/11/2018   Back strain 12/11/2018   Lightheadedness 12/11/2018   Right elbow pain 03/18/2019   Onychomycosis 07/22/2019   Allergic rhinitis 01/21/2020   Hematuria 01/21/2020   Colon cancer screening 01/21/2020   Skin lesion of scalp 02/15/2021   OSA (obstructive sleep apnea) 05/18/2021   Ingrown hair 03/14/2023   Resolved Ambulatory Problems    Diagnosis Date Noted   Hypertension associated with  diabetes (HCC) 12/12/2019   Past Medical History:  Diagnosis Date   UTI (urinary tract infection)     Family History  Problem Relation Age of Onset   Arthritis Mother    Breast cancer Mother    Hyperlipidemia Mother    Heart disease Mother    Hypertension Mother    Mental illness Mother    Lung cancer Father    Arthritis Maternal Grandmother    Stroke Maternal Grandmother    Mental illness Maternal Grandmother    Alcohol abuse Maternal Grandfather    Arthritis Maternal Grandfather    Hyperlipidemia Maternal Grandfather    Heart disease Maternal Grandfather    Hypertension Maternal Grandfather    Diabetes Maternal Grandfather    Arthritis Paternal Grandmother    Diabetes Paternal Grandmother    Arthritis Paternal Grandfather    Lung cancer Paternal Grandfather     Social History   Socioeconomic History   Marital status: Married    Spouse name: Not on file   Number of children: Not on file   Years of education: Not on file   Highest education level: Not on file  Occupational History   Not on file  Tobacco Use   Smoking status: Never   Smokeless tobacco: Current    Types: Snuff  Substance and Sexual Activity   Alcohol use: No   Drug use: No   Sexual activity: Yes    Partners: Female  Other Topics Concern   Not on file  Social History Narrative   Not on file  Social Drivers of Corporate investment banker Strain: Not on file  Food Insecurity: Not on file  Transportation Needs: Not on file  Physical Activity: Not on file  Stress: Not on file  Social Connections: Not on file  Intimate Partner Violence: Not on file    ROS  General:  Negative for nexplained weight loss, fever Skin: Negative for new or changing mole, sore that won't heal HEENT: Negative for trouble hearing, trouble seeing, ringing in ears, mouth sores, hoarseness, change in voice, dysphagia. CV:  Negative for chest pain, dyspnea, edema, palpitations Resp: Negative for cough, dyspnea,  hemoptysis GI: Negative for nausea, vomiting, diarrhea, constipation, abdominal pain, melena, hematochezia. GU: Negative for dysuria, incontinence, urinary hesitance, hematuria, vaginal or penile discharge, polyuria, sexual difficulty, lumps in testicle or breasts MSK: Negative for muscle cramps or aches, joint pain or swelling Neuro: Negative for headaches, weakness, numbness, dizziness, passing out/fainting Psych: Negative for depression, anxiety, memory problems  Objective  Physical Exam Vitals:   03/14/23 1320  BP: 118/78  Pulse: 76  Temp: 98.3 F (36.8 C)  SpO2: 97%    BP Readings from Last 3 Encounters:  03/14/23 118/78  08/22/22 124/78  02/21/22 112/80   Wt Readings from Last 3 Encounters:  03/14/23 255 lb 9.6 oz (115.9 kg)  08/22/22 257 lb 9.6 oz (116.8 kg)  02/21/22 259 lb 6.4 oz (117.7 kg)    Physical Exam Constitutional:      General: He is not in acute distress.    Appearance: He is not diaphoretic.  HENT:     Head: Normocephalic and atraumatic.   Cardiovascular:     Rate and Rhythm: Normal rate and regular rhythm.     Heart sounds: Normal heart sounds.  Pulmonary:     Effort: Pulmonary effort is normal.     Breath sounds: Normal breath sounds.  Abdominal:     General: Bowel sounds are normal. There is no distension.     Palpations: Abdomen is soft.     Tenderness: There is no abdominal tenderness.  Musculoskeletal:     Right lower leg: No edema.     Left lower leg: No edema.  Lymphadenopathy:     Cervical: No cervical adenopathy.  Skin:    General: Skin is warm and dry.  Neurological:     Mental Status: He is alert.  Psychiatric:        Mood and Affect: Mood normal.      Assessment/Plan:   Encounter for general adult medical examination with abnormal findings Assessment & Plan: Physical exam completed.  Encouraged healthy diet and exercise.  PSA done for prostate cancer screening.  Patient will work on scheduling his colonoscopy next year  at his request.  He declines tetanus vaccine, flu vaccine, and Shingrix vaccine.  He understands the purpose of these and knows they are available if he changes his mind.  Encouraged tobacco cessation.  He notes he is going to try using nicotine gum next year.  Encouraged patient to see the dentist and ophthalmologist as well.  Lab work as outlined.   Prostate cancer screening -     PSA  Type 2 diabetes mellitus without complication, without long-term current use of insulin (HCC) Assessment & Plan: We will start the patient on ozempic.  They were counseled on the risk of pancreatitis and gallbladder disease.  Discussed the risk of nausea.  They were advised to discontinue the [facility] and contact us immediately if they develop abdominal pain.  If they  develop excessive nausea they will contact us right away.  I discussed that medullary thyroid cancer has been seen in rat studies.  The patient confirmed no personal or family history of thyroid cancer, parathyroid cancer, or adrenal gland cancer.  Discussed that we thus far have not seen medullary thyroid cancer result from use of this type of medication in humans.  They were advised to monitor the thyroid area and contact us for any lumps, swelling, trouble swallowing, or any other changes in this area.  Patient will also continue jardiance and metformin.    Orders: -     Hemoglobin A1c -     Semaglutide(0.25 or 0.5MG /DOS); Inject 0.25 mg into the skin once a week for 28 days, THEN 0.5 mg once a week.  Dispense: 3 mL; Refill: 1  Mixed hyperlipidemia -     Comprehensive metabolic panel -     Lipid panel  Ingrown hair Assessment & Plan: Area on neck appears to be an ingrown hair.  Discussed using warm compresses to help it come to ahead and drain.  If not resolving after drainage or if not draining he will let us know.     Return in about 6 months (around 09/12/2023) for for transfer of care if patient decides to continue to come to this  office.   [name], MD [facility] [place]

## 2023-03-14 NOTE — Assessment & Plan Note (Signed)
Area on neck appears to be an ingrown hair.  Discussed using warm compresses to help it come to ahead and drain.  If not resolving after drainage or if not draining he will let us know.

## 2023-03-14 NOTE — Assessment & Plan Note (Signed)
Physical exam completed.  Encouraged healthy diet and exercise.  PSA done for prostate cancer screening.  Patient will work on scheduling his colonoscopy next year at his request.  He declines tetanus vaccine, flu vaccine, and Shingrix vaccine.  He understands the purpose of these and knows they are available if he changes his mind.  Encouraged tobacco cessation.  He notes he is going to try using nicotine gum next year.  Encouraged patient to see the dentist and ophthalmologist as well.  Lab work as outlined.

## 2023-03-15 LAB — COMPREHENSIVE METABOLIC PANEL
ALT: 10 U/L (ref 0–53)
AST: 13 U/L (ref 0–37)
Albumin: 4.5 g/dL (ref 3.5–5.2)
Alkaline Phosphatase: 83 U/L (ref 39–117)
BUN: 19 mg/dL (ref 6–23)
CO2: 27 meq/L (ref 19–32)
Calcium: 9.1 mg/dL (ref 8.4–10.5)
Chloride: 102 meq/L (ref 96–112)
Creatinine, Ser: 0.96 mg/dL (ref 0.40–1.50)
GFR: 90.07 mL/min (ref >60.00)
Glucose, Bld: 94 mg/dL (ref 70–99)
Potassium: 4.2 meq/L (ref 3.5–5.1)
Sodium: 139 meq/L (ref 135–145)
Total Bilirubin: 0.6 mg/dL (ref 0.2–1.2)
Total Protein: 6.7 g/dL (ref 6.0–8.3)

## 2023-03-15 LAB — PSA: PSA: 0.73 ng/mL (ref 0.10–4.00)

## 2023-03-15 LAB — LIPID PANEL
Cholesterol: 79 mg/dL (ref 0–200)
HDL: 31.8 mg/dL — ABNORMAL LOW (ref >39.00)
LDL Cholesterol: 22 mg/dL (ref 0–99)
NonHDL: 47.44
Total CHOL/HDL Ratio: 2
Triglycerides: 129 mg/dL (ref 0.0–149.0)
VLDL: 25.8 mg/dL (ref 0.0–40.0)

## 2023-03-15 LAB — HEMOGLOBIN A1C: Hgb A1c MFr Bld: 7.4 % — ABNORMAL HIGH (ref 4.6–6.5)

## 2023-03-16 NOTE — Assessment & Plan Note (Signed)
We will start the patient on ozempic.  They were counseled on the risk of pancreatitis and gallbladder disease.  Discussed the risk of nausea.  They were advised to discontinue the [facility] and contact us immediately if they develop abdominal pain.  If they develop excessive nausea they will contact us right away.  I discussed that medullary thyroid cancer has been seen in rat studies.  The patient confirmed no personal or family history of thyroid cancer, parathyroid cancer, or adrenal gland cancer.  Discussed that we thus far have not seen medullary thyroid cancer result from use of this type of medication in humans.  They were advised to monitor the thyroid area and contact us for any lumps, swelling, trouble swallowing, or any other changes in this area.  Patient will also continue jardiance and metformin.

## 2023-04-11 ENCOUNTER — Other Ambulatory Visit: Payer: Self-pay | Admitting: Family Medicine

## 2023-04-11 DIAGNOSIS — E782 Mixed hyperlipidemia: Secondary | ICD-10-CM

## 2023-04-11 DIAGNOSIS — E119 Type 2 diabetes mellitus without complications: Secondary | ICD-10-CM

## 2023-04-11 DIAGNOSIS — I10 Essential (primary) hypertension: Secondary | ICD-10-CM

## 2023-04-12 ENCOUNTER — Other Ambulatory Visit: Payer: Self-pay | Admitting: Family Medicine

## 2023-04-12 ENCOUNTER — Other Ambulatory Visit: Payer: Self-pay

## 2023-04-12 DIAGNOSIS — E119 Type 2 diabetes mellitus without complications: Secondary | ICD-10-CM

## 2023-04-12 DIAGNOSIS — I10 Essential (primary) hypertension: Secondary | ICD-10-CM

## 2023-04-12 DIAGNOSIS — E782 Mixed hyperlipidemia: Secondary | ICD-10-CM

## 2023-04-12 MED ORDER — METFORMIN HCL 1000 MG PO TABS
1000.0000 mg | ORAL_TABLET | Freq: Two times a day (BID) | ORAL | 1 refills | Status: AC
Start: 2023-04-12 — End: ?
  Filled 2023-04-12: qty 180, 90d supply, fill #0
  Filled 2023-07-30: qty 180, 90d supply, fill #1

## 2023-04-12 MED ORDER — ROSUVASTATIN CALCIUM 20 MG PO TABS
20.0000 mg | ORAL_TABLET | Freq: Every day | ORAL | 3 refills | Status: AC
  Filled 2023-04-12: qty 90, 90d supply, fill #0
  Filled 2023-07-30: qty 90, 90d supply, fill #1
  Filled 2023-10-30: qty 90, 90d supply, fill #2
  Filled 2024-02-13: qty 90, 90d supply, fill #3

## 2023-04-12 MED FILL — Bupropion HCl Tab ER 24HR 150 MG: ORAL | 90 days supply | Qty: 90 | Fill #0 | Status: AC

## 2023-04-12 MED FILL — Amlodipine Besylate Tab 5 MG (Base Equivalent): ORAL | 90 days supply | Qty: 90 | Fill #0 | Status: AC

## 2023-04-12 MED FILL — Empagliflozin Tab 25 MG: ORAL | 90 days supply | Qty: 90 | Fill #0 | Status: AC

## 2023-04-12 MED FILL — Losartan Potassium Tab 100 MG: ORAL | 90 days supply | Qty: 90 | Fill #0 | Status: AC

## 2023-04-13 ENCOUNTER — Other Ambulatory Visit: Payer: Self-pay

## 2023-04-16 ENCOUNTER — Other Ambulatory Visit: Payer: Self-pay

## 2023-05-24 ENCOUNTER — Other Ambulatory Visit: Payer: Self-pay | Admitting: Family Medicine

## 2023-05-24 DIAGNOSIS — E119 Type 2 diabetes mellitus without complications: Secondary | ICD-10-CM

## 2023-05-25 ENCOUNTER — Other Ambulatory Visit: Payer: Self-pay | Admitting: Family Medicine

## 2023-05-25 ENCOUNTER — Other Ambulatory Visit: Payer: Self-pay

## 2023-05-25 DIAGNOSIS — E119 Type 2 diabetes mellitus without complications: Secondary | ICD-10-CM

## 2023-05-25 MED FILL — Semaglutide Soln Pen-inj 0.25 or 0.5 MG/DOSE (2 MG/3ML): SUBCUTANEOUS | 28 days supply | Qty: 3 | Fill #0 | Status: AC

## 2023-06-21 ENCOUNTER — Other Ambulatory Visit: Payer: Self-pay

## 2023-06-21 ENCOUNTER — Other Ambulatory Visit: Payer: Self-pay | Admitting: Internal Medicine

## 2023-06-21 DIAGNOSIS — E119 Type 2 diabetes mellitus without complications: Secondary | ICD-10-CM

## 2023-06-22 ENCOUNTER — Other Ambulatory Visit: Payer: Self-pay

## 2023-06-22 MED FILL — Semaglutide Soln Pen-inj 0.25 or 0.5 MG/DOSE (2 MG/3ML): SUBCUTANEOUS | 28 days supply | Qty: 3 | Fill #0 | Status: AC

## 2023-06-29 ENCOUNTER — Other Ambulatory Visit: Payer: Self-pay

## 2023-07-19 ENCOUNTER — Other Ambulatory Visit: Payer: Self-pay | Admitting: Internal Medicine

## 2023-07-19 DIAGNOSIS — E119 Type 2 diabetes mellitus without complications: Secondary | ICD-10-CM

## 2023-07-23 ENCOUNTER — Other Ambulatory Visit: Payer: Self-pay

## 2023-07-23 ENCOUNTER — Other Ambulatory Visit: Payer: Self-pay | Admitting: Internal Medicine

## 2023-07-23 DIAGNOSIS — E119 Type 2 diabetes mellitus without complications: Secondary | ICD-10-CM

## 2023-07-23 MED FILL — Semaglutide Soln Pen-inj 0.25 or 0.5 MG/DOSE (2 MG/3ML): SUBCUTANEOUS | 28 days supply | Qty: 3 | Fill #0 | Status: AC

## 2023-07-30 ENCOUNTER — Other Ambulatory Visit: Payer: Self-pay

## 2023-07-30 MED FILL — Losartan Potassium Tab 100 MG: ORAL | 90 days supply | Qty: 90 | Fill #1 | Status: AC

## 2023-07-30 MED FILL — Amlodipine Besylate Tab 5 MG (Base Equivalent): ORAL | 90 days supply | Qty: 90 | Fill #1 | Status: AC

## 2023-07-30 MED FILL — Empagliflozin Tab 25 MG: ORAL | 90 days supply | Qty: 90 | Fill #1 | Status: AC

## 2023-07-31 ENCOUNTER — Other Ambulatory Visit: Payer: Self-pay

## 2023-08-01 ENCOUNTER — Other Ambulatory Visit: Payer: Self-pay

## 2023-08-02 ENCOUNTER — Other Ambulatory Visit: Payer: Self-pay

## 2023-08-02 ENCOUNTER — Ambulatory Visit

## 2023-08-02 VITALS — BP 118/90 | HR 64 | Temp 98.2°F | Ht 74.0 in | Wt 235.8 lb

## 2023-08-02 DIAGNOSIS — D171 Benign lipomatous neoplasm of skin and subcutaneous tissue of trunk: Secondary | ICD-10-CM

## 2023-08-02 DIAGNOSIS — E119 Type 2 diabetes mellitus without complications: Secondary | ICD-10-CM

## 2023-08-02 DIAGNOSIS — E782 Mixed hyperlipidemia: Secondary | ICD-10-CM

## 2023-08-02 DIAGNOSIS — I8393 Asymptomatic varicose veins of bilateral lower extremities: Secondary | ICD-10-CM

## 2023-08-02 DIAGNOSIS — B356 Tinea cruris: Secondary | ICD-10-CM | POA: Diagnosis not present

## 2023-08-02 DIAGNOSIS — F419 Anxiety disorder, unspecified: Secondary | ICD-10-CM

## 2023-08-02 DIAGNOSIS — Z125 Encounter for screening for malignant neoplasm of prostate: Secondary | ICD-10-CM

## 2023-08-02 DIAGNOSIS — Z7985 Long-term (current) use of injectable non-insulin antidiabetic drugs: Secondary | ICD-10-CM

## 2023-08-02 DIAGNOSIS — I10 Essential (primary) hypertension: Secondary | ICD-10-CM

## 2023-08-02 DIAGNOSIS — E1169 Type 2 diabetes mellitus with other specified complication: Secondary | ICD-10-CM | POA: Diagnosis not present

## 2023-08-02 DIAGNOSIS — E669 Obesity, unspecified: Secondary | ICD-10-CM

## 2023-08-02 DIAGNOSIS — F32A Depression, unspecified: Secondary | ICD-10-CM

## 2023-08-02 LAB — HEMOGLOBIN A1C: Hgb A1c MFr Bld: 6.2 % (ref 4.6–6.5)

## 2023-08-02 MED ORDER — EMPAGLIFLOZIN 25 MG PO TABS
25.0000 mg | ORAL_TABLET | Freq: Every day | ORAL | 1 refills | Status: AC
  Filled 2023-10-30: qty 90, 90d supply, fill #0
  Filled 2024-02-13: qty 90, 90d supply, fill #1

## 2023-08-02 MED ORDER — METFORMIN HCL 1000 MG PO TABS: 1000.0000 mg | ORAL_TABLET | Freq: Two times a day (BID) | ORAL | 1 refills | Status: AC

## 2023-08-02 MED ORDER — BUPROPION HCL ER (XL) 150 MG PO TB24
150.0000 mg | ORAL_TABLET | Freq: Every day | ORAL | 1 refills | Status: DC
  Filled 2023-08-02 – 2023-08-20 (×2): qty 90, 90d supply, fill #0
  Filled 2023-11-13: qty 90, 90d supply, fill #1

## 2023-08-02 MED ORDER — KETOCONAZOLE 2 % EX CREA
1.0000 | TOPICAL_CREAM | Freq: Every day | CUTANEOUS | 1 refills | Status: AC
  Filled 2023-08-02 – 2023-10-23 (×2): qty 30, 30d supply, fill #0

## 2023-08-02 MED ORDER — LOSARTAN POTASSIUM 100 MG PO TABS
100.0000 mg | ORAL_TABLET | Freq: Every day | ORAL | 3 refills | Status: AC
Start: 2023-10-02 — End: ?

## 2023-08-02 NOTE — Assessment & Plan Note (Signed)
 Discussed wearing compression stockings, will monitor for signs of progression and consider referral to vascular surgery if symptoms worsens.

## 2023-08-02 NOTE — Assessment & Plan Note (Signed)
 Continue crestor  20 mg once daily.  LDL is well controlled.

## 2023-08-02 NOTE — Assessment & Plan Note (Signed)
 Chronic issue.  Encouraged continued healthy diet.

## 2023-08-02 NOTE — Assessment & Plan Note (Signed)
 PSA future order done.

## 2023-08-02 NOTE — Progress Notes (Signed)
 Established Patient Office Visit   Subjective  Patient ID: Bradley Stevens, male    DOB: [birth year]  Age: 53 y.o. MRN: [id number]  Chief Complaint  Patient presents with   Transitions Of Care    He  has no past medical history on file.  HPI 1) Hypertension: Does not check BP at home. Does not report of chest pain, lower leg edema. On Losartan  100 mg and Amlodipine  5 mg once a day.   2) OSA: He was diagnosed with OSA in 05/2021. He has CPAP. Currently he is not wearing CPAP for about 3 months. Patient reports he did not notice change in energy in the morning. He is not interested in seeing sleep specialist at this time.   3) DM II: Last A1c was 7.4% in 03/14/23. On Jardiance  25 mg, Metformin  1,000 mg BID, Ozempic  0.25 once a week (was started in 03/2023. Denies s/e from Ozempic . Patient denies numbness, tinglings on his legs. He eats mostly homemade food. He is active at home (doing activities like mowing lawn). He is due for diabetic eye exam. UDT on urine microalbumin check. Denies polyuria, polydipsia. Does not check blood glucose at home.  4) Hyperlipidemia: On Crestor  20 mg daily.   5) Due for colorectal cancer screening  6) Due for following immunization:Shingles, Pneumonia, Tdap.   7) Jock itch: Patient uses PRN ketoconazole  for groin rash.   8) Anxiety and depression: On Bupropion  XL 150 mg once a day. Has been taking regularly for about a year. Denies SI/HI.   ROS As per HPI    Objective:     BP (!) 118/90   Pulse 64   Temp 98.2 F (36.8 C) (Oral)   Ht 6\' 2"  (1.88 m)   Wt 235 lb 12.8 oz (107 kg)   SpO2 96%   BMI 30.27 kg/m      08/02/2023    1:02 PM 03/14/2023    1:22 PM 08/22/2022    8:24 AM  Depression screen PHQ 2/9  Decreased Interest 0 0 0  Down, Depressed, Hopeless 0 0 0  PHQ - 2 Score 0 0 0  Altered sleeping 0 0 1  Tired, decreased energy 0 0 1  Change in appetite 0 0 0  Feeling bad or failure about yourself  1 0 0  Trouble concentrating 0 0 0   Moving slowly or fidgety/restless 0 0 0  Suicidal thoughts 0 0 0  PHQ-9 Score 1 0 2  Difficult doing work/chores Not difficult at all Not difficult at all Not difficult at all      08/02/2023    1:02 PM 03/14/2023    1:22 PM 08/22/2022    8:25 AM  GAD 7 : Generalized Anxiety Score  Nervous, Anxious, on Edge 0 0 0  Control/stop worrying 0 0 0  Worry too much - different things 0 0 0  Trouble relaxing 0 0 0  Restless 0 0 0  Easily annoyed or irritable 0 0 0  Afraid - awful might happen 0 0 0  Total GAD 7 Score 0 0 0  Anxiety Difficulty Not difficult at all Not difficult at all Not difficult at all    Physical Exam Constitutional:      General: He is not in acute distress.    Appearance: Normal appearance.  HENT:     Head: Normocephalic and atraumatic.     Right Ear: Tympanic membrane normal.     Left Ear: Tympanic membrane normal.  Mouth/Throat:     Mouth: Mucous membranes are moist.  Eyes:     Pupils: Pupils are equal, round, and reactive to light.  Neck:     Thyroid: No thyroid mass or thyroid tenderness.  Cardiovascular:     Rate and Rhythm: Normal rate and regular rhythm.  Pulmonary:     Effort: Pulmonary effort is normal.     Breath sounds: Normal breath sounds.  Abdominal:     General: Bowel sounds are normal.     Palpations: Abdomen is soft.  Musculoskeletal:     Cervical back: Neck supple. No rigidity.     Right lower leg: No edema.     Left lower leg: No edema.     Comments: Back: Multiple lipomas along midline of spine and one on left lower back noted.  B/L lower extremities varicose veins noted.   Skin:    General: Skin is warm.     Comments: Right lateral knee: Single healing rash with scar.  Neurological:     Mental Status: He is alert and oriented to person, place, and time.  Psychiatric:        Mood and Affect: Mood normal.        Behavior: Behavior normal.        No results found for any visits on 08/02/23.  The ASCVD Risk score  (Arnett DK, et al., 2019) failed to calculate for the following reasons:   The valid total cholesterol range is 130 to 320 mg/dL    Assessment & Plan:  Type 2 diabetes mellitus with other specified complication, without long-term current use of insulin (HCC) Assessment & Plan: Chronic, on Ozempic  0.5 mg once weekly sub q injection, jardiance  25 mg and metformin  1,000 Mg twice a day. Continue. We will check HbA1c1, counseled patient on importance of annual diabetic eye exam. Patient plans on getting eye exam updated. If HbA1c higher than his baseline we discussed increasing dose of Ozempic  to 1 mg once weekly injection. Patient agreeable.    Orders: -     Empagliflozin ; Take 1 tablet (25 mg total) by mouth daily.  Dispense: 90 tablet; Refill: 1 -     Hemoglobin A1c -     Microalbumin / creatinine urine ratio; Future -     Hemoglobin A1c; Future  Mixed hyperlipidemia Assessment & Plan: Continue crestor  20 mg once daily.  LDL is well controlled.   Orders: -     Losartan  Potassium; Take 1 tablet (100 mg total) by mouth daily.  Dispense: 90 tablet; Refill: 3 -     Lipid panel; Future  Jock itch Assessment & Plan: Not currently active, will send refill on ketoconazole  cream for prn use.   Orders: -     Ketoconazole ; Apply 1 Application topically daily.  Dispense: 30 g; Refill: 1  Obesity (BMI 30-39.9) Assessment & Plan: Chronic issue.  Encouraged continued healthy diet.    Essential hypertension Assessment & Plan: Chronic issue.  Goal BP <130/80 mmHg. DBP not within goal. Patient is not interested in adding new medication, increasing dose of current antihypertensive medication at this time.  For now recommend amlodipine  5 mg daily and losartan  100 mg daily. Counseled on continuing with lifestyle modification, healthy diet, exercise. Consider adding hydrochlorothiazide to losartan  if f/u visit with DBP >80 mmHg.   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 Recommend he reaches out  to us  if it becomes painful and we will refer him to general surgery.    Anxiety and depression Assessment & Plan: Chronic, stable on Wellbutrin  150 mg once a day, continue.    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    Prostate cancer screening Assessment & Plan: PSA future order done.   Orders: -     PSA; Future  Other orders -     buPROPion  HCl ER (XL); Take 1 tablet (150 mg total) by mouth daily.  Dispense: 90 tablet; Refill: 1 -     metFORMIN  HCl; Take 1 tablet (1,000 mg total) by mouth 2 (two) times daily with a meal.  Dispense: 180 tablet; Refill: 1  Patient also has a healing rash on right lateral knee. If delayed healing recommend he reaches out to our clinic for dermatology referral.   Return in about 6 months (around 02/02/2024) for chronic follow up, fasting labs 2 days before apt.   Jacklin Mascot, MD

## 2023-08-02 NOTE — Assessment & Plan Note (Signed)
 Chronic, on Ozempic  0.5 mg once weekly sub q injection, jardiance  25 mg and metformin  1,000 Mg twice a day. Continue. We will check HbA1c1, counseled patient on importance of annual diabetic eye exam. Patient plans on getting eye exam updated. If HbA1c higher than his baseline we discussed increasing dose of Ozempic  to 1 mg once weekly injection. Patient agreeable.

## 2023-08-02 NOTE — Assessment & Plan Note (Signed)
 Chronic, stable on Wellbutrin  150 mg once a day, continue.

## 2023-08-02 NOTE — Assessment & Plan Note (Signed)
 Chronic issue.  Goal BP <130/80 mmHg. DBP not within goal. Patient is not interested in adding new medication, increasing dose of current antihypertensive medication at this time.  For now recommend amlodipine  5 mg daily and losartan  100 mg daily. Counseled on continuing with lifestyle modification, healthy diet, exercise. Consider adding hydrochlorothiazide to losartan  if f/u visit with DBP >80 mmHg.

## 2023-08-02 NOTE — Assessment & Plan Note (Signed)
 Chronic, patient reports not bothersome at this time. Recommend he reaches out to us  if it becomes painful and we will refer him to general surgery.

## 2023-08-02 NOTE — Assessment & Plan Note (Signed)
 Not currently active, will send refill on ketoconazole  cream for prn use.

## 2023-08-09 LAB — HM DIABETES EYE EXAM

## 2023-08-14 ENCOUNTER — Other Ambulatory Visit: Payer: Self-pay

## 2023-08-15 ENCOUNTER — Other Ambulatory Visit: Payer: Self-pay

## 2023-08-15 MED FILL — Semaglutide Soln Pen-inj 0.25 or 0.5 MG/DOSE (2 MG/3ML): SUBCUTANEOUS | 28 days supply | Qty: 3 | Fill #1 | Status: CN

## 2023-08-20 ENCOUNTER — Other Ambulatory Visit: Payer: Self-pay

## 2023-08-20 MED FILL — Semaglutide Soln Pen-inj 0.25 or 0.5 MG/DOSE (2 MG/3ML): SUBCUTANEOUS | 28 days supply | Qty: 3 | Fill #1 | Status: AC

## 2023-09-13 ENCOUNTER — Other Ambulatory Visit: Payer: Self-pay | Admitting: Internal Medicine

## 2023-09-13 ENCOUNTER — Other Ambulatory Visit: Payer: Self-pay

## 2023-09-13 DIAGNOSIS — E119 Type 2 diabetes mellitus without complications: Secondary | ICD-10-CM

## 2023-09-14 ENCOUNTER — Other Ambulatory Visit: Payer: Self-pay

## 2023-09-14 DIAGNOSIS — E119 Type 2 diabetes mellitus without complications: Secondary | ICD-10-CM

## 2023-09-14 MED FILL — Semaglutide Soln Pen-inj 0.25 or 0.5 MG/DOSE (2 MG/3ML): SUBCUTANEOUS | 28 days supply | Qty: 3 | Fill #0 | Status: AC

## 2023-09-14 NOTE — Telephone Encounter (Signed)
 Refill sent. This is duplicate order thus canceling.   Jacklin Mascot, MD

## 2023-09-18 ENCOUNTER — Encounter

## 2023-10-10 ENCOUNTER — Other Ambulatory Visit: Payer: Self-pay

## 2023-10-10 MED ORDER — CEPHALEXIN 500 MG PO CAPS
500.0000 mg | ORAL_CAPSULE | Freq: Four times a day (QID) | ORAL | 0 refills | Status: AC
  Filled 2023-10-10: qty 27, 6d supply, fill #0
  Filled 2023-10-10: qty 1, 1d supply, fill #0

## 2023-10-10 MED ORDER — VALACYCLOVIR HCL 1 G PO TABS
1000.0000 mg | ORAL_TABLET | Freq: Two times a day (BID) | ORAL | 0 refills | Status: AC
  Filled 2023-10-10: qty 14, 7d supply, fill #0

## 2023-10-10 MED ORDER — TRIAMCINOLONE ACETONIDE 0.1 % EX CREA
1.0000 | TOPICAL_CREAM | Freq: Two times a day (BID) | CUTANEOUS | 0 refills | Status: AC
Start: 2023-10-10 — End: 2023-10-24
  Filled 2023-10-10: qty 15, 8d supply, fill #0

## 2023-10-23 ENCOUNTER — Other Ambulatory Visit: Payer: Self-pay

## 2023-10-23 MED FILL — Semaglutide Soln Pen-inj 0.25 or 0.5 MG/DOSE (2 MG/3ML): SUBCUTANEOUS | 28 days supply | Qty: 3 | Fill #1 | Status: AC

## 2023-10-30 ENCOUNTER — Other Ambulatory Visit: Payer: Self-pay

## 2023-10-30 MED FILL — Losartan Potassium Tab 100 MG: ORAL | 90 days supply | Qty: 90 | Fill #2 | Status: AC

## 2023-11-13 ENCOUNTER — Other Ambulatory Visit: Payer: Self-pay

## 2023-11-13 MED FILL — Amlodipine Besylate Tab 5 MG (Base Equivalent): ORAL | 90 days supply | Qty: 90 | Fill #2 | Status: AC

## 2023-11-29 ENCOUNTER — Other Ambulatory Visit: Payer: Self-pay

## 2023-11-29 DIAGNOSIS — E119 Type 2 diabetes mellitus without complications: Secondary | ICD-10-CM

## 2023-11-29 MED ORDER — OZEMPIC (0.25 OR 0.5 MG/DOSE) 2 MG/3ML ~~LOC~~ SOPN
0.5000 mg | PEN_INJECTOR | SUBCUTANEOUS | 2 refills | Status: DC
  Filled 2023-11-29: qty 3, 28d supply, fill #0
  Filled 2024-01-10: qty 3, 28d supply, fill #1
  Filled 2024-02-13: qty 3, 28d supply, fill #2

## 2024-02-01 ENCOUNTER — Other Ambulatory Visit

## 2024-02-05 ENCOUNTER — Ambulatory Visit

## 2024-02-13 ENCOUNTER — Other Ambulatory Visit: Payer: Self-pay

## 2024-02-13 MED ORDER — BUPROPION HCL ER (XL) 150 MG PO TB24
150.0000 mg | ORAL_TABLET | Freq: Every day | ORAL | 1 refills | Status: AC
  Filled 2024-02-13: qty 90, 90d supply, fill #0

## 2024-02-13 MED FILL — Amlodipine Besylate Tab 5 MG (Base Equivalent): ORAL | 90 days supply | Qty: 90 | Fill #3 | Status: AC

## 2024-02-13 MED FILL — Losartan Potassium Tab 100 MG: ORAL | 90 days supply | Qty: 90 | Fill #3 | Status: AC

## 2024-02-19 ENCOUNTER — Other Ambulatory Visit: Payer: Self-pay

## 2024-02-20 ENCOUNTER — Other Ambulatory Visit: Payer: Self-pay

## 2024-03-25 ENCOUNTER — Other Ambulatory Visit: Payer: Self-pay

## 2024-03-25 DIAGNOSIS — E119 Type 2 diabetes mellitus without complications: Secondary | ICD-10-CM

## 2024-03-25 MED ORDER — OZEMPIC (0.25 OR 0.5 MG/DOSE) 2 MG/3ML ~~LOC~~ SOPN
0.5000 mg | PEN_INJECTOR | SUBCUTANEOUS | 3 refills | Status: AC
  Filled 2024-03-25: qty 9, 84d supply, fill #0
